# Patient Record
Sex: Female | Born: 1955 | Race: White | Hispanic: No | Smoking: Current every day smoker
Health system: Southern US, Community
[De-identification: ages and names within clinical notes are randomized; demographics above are authoritative.]

## PROBLEM LIST (undated history)

## (undated) DIAGNOSIS — R1900 Intra-abdominal and pelvic swelling, mass and lump, unspecified site: Secondary | ICD-10-CM

## (undated) DIAGNOSIS — D3910 Neoplasm of uncertain behavior of unspecified ovary: Secondary | ICD-10-CM

## (undated) DIAGNOSIS — M7989 Other specified soft tissue disorders: Secondary | ICD-10-CM

## (undated) DIAGNOSIS — R0602 Shortness of breath: Secondary | ICD-10-CM

## (undated) HISTORY — DX: Neoplasm of uncertain behavior of unspecified ovary: D39.10

## (undated) HISTORY — DX: Intra-abdominal and pelvic swelling, mass and lump, unspecified site: R19.00

## (undated) HISTORY — PX: CYSTECTOMY: SUR359

---

## 2010-10-30 DEATH — deceased

## 2014-05-03 ENCOUNTER — Encounter: Payer: Self-pay | Admitting: Gynecology

## 2014-05-03 ENCOUNTER — Ambulatory Visit: Payer: Self-pay | Attending: Gynecology | Admitting: Gynecology

## 2014-05-03 VITALS — BP 130/92 | HR 99 | Temp 97.7°F | Resp 24 | Ht 67.0 in | Wt 216.4 lb

## 2014-05-03 DIAGNOSIS — F17200 Nicotine dependence, unspecified, uncomplicated: Secondary | ICD-10-CM | POA: Insufficient documentation

## 2014-05-03 DIAGNOSIS — R971 Elevated cancer antigen 125 [CA 125]: Secondary | ICD-10-CM | POA: Insufficient documentation

## 2014-05-03 DIAGNOSIS — R1900 Intra-abdominal and pelvic swelling, mass and lump, unspecified site: Secondary | ICD-10-CM | POA: Insufficient documentation

## 2014-05-03 DIAGNOSIS — Z801 Family history of malignant neoplasm of trachea, bronchus and lung: Secondary | ICD-10-CM | POA: Insufficient documentation

## 2014-05-03 DIAGNOSIS — Z803 Family history of malignant neoplasm of breast: Secondary | ICD-10-CM | POA: Insufficient documentation

## 2014-05-03 NOTE — Progress Notes (Signed)
Consult Note: Gyn-Onc   [name] 57 y.o. female  Chief Complaint  Patient presents with  . 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 Total abdominal hysterectomy bilateral salpingo-oophorectomy. Should malignancy be identified and surgical staging will be undertaken.  Surgery scheduled for 05/17/2014. Risks of surgery reviewed and all questions are answered.    HPI: Patient is seen in consultation request of Dr. Armandina Stammer regarding management of a newly diagnosed large abdominal pelvic mass. Patient notes that she's had several months of increasing abdominal girth and weight gain. She became so uncomfortable she saw her gynecologist who obtained a CT scan showing a 32 x 20 x 29.5 cm abdominal pelvic mass which is predominantly cystic with multiple septations although there is a relatively large complex soft tissue component seen anteriorly. Patient is bilateral hydronephrosis secondary to extrinsic compression. Otherwise is no evidence of adenopathy ascites or any other suggestion of metastatic disease. The patient CA 125 is 81.9 units per mL and CEA is 4.9 ng per mL.  She has no past gynecologic history. Sexual history gravida 1. She has been menopausal for approximately 9 years.  Review of Systems:10 point review of systems is negative except as noted in interval history.   Vitals: Blood pressure 130/92, pulse 99, temperature 97.7 F (36.5 C), temperature source Oral, resp. rate 24, height 5\' 7"  (1.702 m), weight 216 lb 6.4 oz (98.158 kg).  Physical Exam: General : The patient is a healthy woman in no acute distress.  HEENT: normocephalic, extraoccular movements normal; neck is supple without thyromegally  Lynphnodes: Supraclavicular and inguinal nodes not enlarged  Abdomen: Massively distended with a mass extending  to the xiphoid. I am unable to detect any ascites. Patient has minimal tenderness to palpation. Pelvic:  EGBUS: Normal female  Vagina: Normal, no lesions  Urethra and Bladder: Normal, non-tender  Cervix: Deviated sharply anterior. Uterus: Difficult to identify secondary to the mass and the patient's habitus. Bi-manual examination: Non-tender; no adenxal masses or nodularity  Rectal: normal sphincter tone, no masses, no blood  Lower extremities: No edema or varicosities. Normal range of motion      No Known Allergies  Past Medical History  Diagnosis Date  . Pelvic mass in female     Past Surgical History  Procedure Laterality Date  . Cystectomy      "on privates" 25 years ago    Current Outpatient Prescriptions  Medication Sig Dispense Refill  . oxyCODONE-acetaminophen (PERCOCET/ROXICET) 5-325 MG per tablet Take 1-2 tablets by mouth every 4 (four) hours as needed for severe pain.       No current facility-administered medications for this visit.    History   Social History  . Marital Status: Single    Spouse Name: N/A    Number of Children: N/A  . Years of Education: N/A   Occupational History  . Not on file.   Social History Main Topics  . Smoking status: Current Every Day Smoker -- 0.25 packs/day for 41 years  . Smokeless tobacco: Not on file  . Alcohol Use: No  . Drug Use: No  . Sexual Activity: Not on file   Other Topics Concern  . Not on file   Social History Narrative  . No narrative on file    Family History  Problem Relation Age of Onset  .  Breast cancer Mother   . Lung cancer Maternal [place], MD 05/03/2014, 2:56 PM

## 2014-05-03 NOTE — Patient Instructions (Signed)
Plan for surgery on May 19 with Dr. Fermin Schwab at [facility].                  Preparing for your Surgery  Pre-operative Testing -You will receive a phone call from presurgical testing at [facility] to arrange for a pre-operative testing appointment before your surgery.  This appointment normally occurs one to two weeks before your scheduled surgery.   -Bring your insurance card, copy of an advanced directive if applicable, medication list  -At that visit, you will be asked to sign a consent for a possible blood transfusion in case a transfusion becomes necessary during surgery.  The need for a blood transfusion is rare but having consent is a necessary part of your care.     Day Before Surgery at [facility] will be asked to take in only clear liquids the day before surgery.  Examples of clear liquids include broths, jello, and clear juices.  You will need to perform a fleets enema the night before your surgery based off of your provider's recommendations.  You will be advised to have nothing to eat or drink after midnight the evening before.    Your role in recovery Your role is to become active as soon as directed by your doctor, while still giving yourself time to heal.  Rest when you feel tired. You will be asked to do the following in order to speed your recovery:  - Cough and breathe deeply. This helps toclear and expand your lungs and can prevent pneumonia. You may be given a spirometer to practice deep breathing. A staff member will show you how to use the spirometer. - Do mild physical activity. Walking or moving your legs help your circulation and body functions return to normal. A staff member will help you when you try to walk and will provide you with simple exercises. Do not try to get up or walk alone the first time. - Actively manage your pain. Managing your pain lets you move in comfort. We will ask you to rate your pain on a scale of zero to 10. It is your  responsibility to tell your doctor or nurse where and how much you hurt so your pain can be treated.  Special Considerations -If you are diabetic, you may be placed on insulin after surgery to have closer control over your blood sugars to promote healing and recovery.  This does not mean that you will be discharged on insulin.  If applicable, your oral antidiabetics will be resumed when you are tolerating a solid diet.  -Your final pathology results from surgery should be available by the Friday after surgery and the results will be relayed to you when available.

## 2014-05-04 ENCOUNTER — Telehealth: Payer: Self-pay | Admitting: *Deleted

## 2014-05-04 NOTE — Telephone Encounter (Signed)
Called pt as no insurance was on file in epic. Pt states she does not have insurance, denied for medicaid. Asked pt to find the letter from [facility] showing she was denied, we can set her up an appt for financial counselor to speak with her for possible assistance. Pt advised she would try to find letter and call us

## 2014-05-05 ENCOUNTER — Encounter (HOSPITAL_COMMUNITY): Payer: Self-pay | Admitting: Pharmacy Technician

## 2014-05-06 NOTE — Progress Notes (Signed)
Chest x ray, ekg 2/15 chart

## 2014-05-06 NOTE — Patient Instructions (Addendum)
Your procedure is scheduled on:  05/17/14  TUESDAY  Report to [facility] at   745    AM.  Call this number if you have problems the morning of surgery: [phone]      call Chayna Surratt with inhaler name phone [phone].   Do not eat food :After Midnight.SUNDAY NIGHT-- BEGIN CLEAR LIQUIDS ONLY ON Monday.   DO NOT TAKE ANYTHING BY MOUTH AFTER MIDNIGHT Monday NIGHT FLEETS ENEMA BEFORE BED MONDAY NIGHT   Take these medicines the morning of surgery with A SIP OF WATER:PERCOCET IF NEEDED, INHLER IF NEEDED, BRING INHALER AND LEAVE WITH BOYFRIEND RICHARD COBLE   .  Contacts, dentures or partial plates, or metal hairpins  can not be worn to surgery. Your family will be responsible for glasses, dentures, hearing aides while you are in surgery  Leave suitcase in the car. After surgery it may be brought to your room.  For patients admitted to the hospital, checkout time is 11:00 AM day of  discharge.                DO NOT WEAR JEWELRY, LOTIONS, POWDERS, OR PERFUMES.  WOMEN-- DO NOT SHAVE LEGS OR UNDERARMS FOR 48 HOURS BEFORE SHOWERS. MEN MAY SHAVE FACE.  Patients discharged the day of surgery will not be allowed to drive home. IF going home the day of surgery, you must have a driver and someone to stay with you for the first 24 hours                                                                                                                                [facility] - Preparing for Surgery Before surgery, you can play an important role.  Because skin is not sterile, your skin needs to be as free of germs as possible.  You can reduce the number of germs on your skin by washing with CHG (chlorahexidine gluconate) soap before surgery.  CHG is an antiseptic cleaner which kills germs and bonds with the skin to continue killing germs even after washing. Please DO NOT use if you have an allergy to CHG or antibacterial soaps.  If your skin becomes reddened/irritated stop using the CHG and  inform your nurse when you arrive at Short Stay. Do not shave (including legs and underarms) for at least 48 hours prior to the first CHG shower.  You may shave your face. Please follow these instructions carefully:  1.  Shower with CHG Soap the night before surgery and the  morning of Surgery.  2.  If you choose to wash your hair, wash your hair first as usual with your  normal  shampoo.  3.  After you shampoo, rinse your hair and body thoroughly to remove the  shampoo.  4.  Use CHG as you would any other liquid soap.  You can apply chg directly  to the skin and wash                       Gently with a scrungie or clean washcloth.  5.  Apply the CHG Soap to your body ONLY FROM THE NECK DOWN.   Do not use on open                           Wound or open sores. Avoid contact with eyes, ears mouth and genitals (private parts).                        Genitals (private parts) with your normal soap.             6.  Wash thoroughly, paying special attention to the area where your surgery  will be performed.  7.  Thoroughly rinse your body with warm water from the neck down.  8.  DO NOT shower/wash with your normal soap after using and rinsing off  the CHG Soap.                9.  Pat yourself dry with a clean towel.            10.  Wear clean pajamas.            11.  Place clean sheets on your bed the night of your first shower and do not  sleep with pets. Day of Surgery : Do not apply any lotions/deodorants the morning of surgery.  Please wear clean clothes to the hospital/surgery center.  FAILURE TO FOLLOW THESE INSTRUCTIONS MAY RESULT IN THE CANCELLATION OF YOUR SURGERY PATIENT SIGNATURE_________________________________  NURSE SIGNATURE__________________________________  ________________________________________________________________________    CLEAR LIQUID DIET   Foods Allowed                                                                     Foods  Excluded  Coffee and tea, regular and decaf                             liquids that you cannot  Plain Jell-O in any flavor                                             see through such as: Fruit ices (not with fruit pulp)                                     milk, soups, orange juice  Iced Popsicles                                    All solid food Carbonated beverages, regular and diet  Cranberry, grape and apple juices Sports drinks like Gatorade Lightly seasoned clear broth or consume(fat free) Sugar, honey syrup  Sample Menu Breakfast                                Lunch                                     Supper Cranberry juice                    Beef broth                            Chicken broth Jell-O                                     Grape juice                           Apple juice Coffee or tea                        Jell-O                                      Popsicle                                                Coffee or tea                        Coffee or tea  _____________________________________________________________________   WHAT IS A BLOOD TRANSFUSION? Blood Transfusion Information  A transfusion is the replacement of blood or some of its parts. Blood is made up of multiple cells which provide different functions.  Red blood cells carry oxygen and are used for blood loss replacement.  White blood cells fight against infection.  Platelets control bleeding.  Plasma helps clot blood.  Other blood products are available for specialized needs, such as hemophilia or other clotting disorders. BEFORE THE TRANSFUSION  Who gives blood for transfusions?   Healthy volunteers who are fully evaluated to make sure their blood is safe. This is blood bank blood. Transfusion therapy is the safest it has ever been in the practice of medicine. Before blood is taken from a donor, a complete history is taken to make sure that person has no  history of diseases nor engages in risky social behavior (examples are intravenous drug use or sexual activity with multiple partners). The donor's travel history is screened to minimize risk of transmitting infections, such as malaria. The donated blood is tested for signs of infectious diseases, such as HIV and hepatitis. The blood is then tested to be sure it is compatible with you in order to minimize the chance of a transfusion reaction. If you or a relative donates blood, this is often done in anticipation of surgery and is not appropriate for emergency situations. It takes many days to process the donated blood.  RISKS AND COMPLICATIONS Although transfusion therapy is very safe and saves many lives, the main dangers of transfusion include:   Getting an infectious disease.  Developing a transfusion reaction. This is an allergic reaction to something in the blood you were given. Every precaution is taken to prevent this. The decision to have a blood transfusion has been considered carefully by your caregiver before blood is given. Blood is not given unless the benefits outweigh the risks. AFTER THE TRANSFUSION  Right after receiving a blood transfusion, you will usually feel much better and more energetic. This is especially true if your red blood cells have gotten low (anemic). The transfusion raises the level of the red blood cells which carry oxygen, and this usually causes an energy increase.  The nurse administering the transfusion will monitor you carefully for complications. HOME CARE INSTRUCTIONS  No special instructions are needed after a transfusion. You may find your energy is better. Speak with your caregiver about any limitations on activity for underlying diseases you may have. SEEK MEDICAL CARE IF:   Your condition is not improving after your transfusion.  You develop redness or irritation at the intravenous (IV) site. SEEK IMMEDIATE MEDICAL CARE IF:  Any of the following  symptoms occur over the next 12 hours:  Shaking chills.  You have a temperature by mouth above 102 F (38.9 C), not controlled by medicine.  Chest, back, or muscle pain.  People around you feel you are not acting correctly or are confused.  Shortness of breath or difficulty breathing.  Dizziness and fainting.  You get a rash or develop hives.  You have a decrease in urine output.  Your urine turns a dark color or changes to pink, red, or brown. Any of the following symptoms occur over the next 10 days:  You have a temperature by mouth above 102 F (38.9 C), not controlled by medicine.  Shortness of breath.  Weakness after normal activity.  The white part of the eye turns yellow (jaundice).  You have a decrease in the amount of urine or are urinating less often.  Your urine turns a dark color or changes to pink, red, or brown. Document Released: 12/13/2000 Document Revised: 03/09/2012 Document Reviewed: 08/01/2008 ExitCare Patient Information 2014 [place].  _______________________________________________________________________  Incentive Spirometer  An incentive spirometer is a tool that can help keep your lungs clear and active. This tool measures how well you are filling your lungs with each breath. Taking long deep breaths may help reverse or decrease the chance of developing breathing (pulmonary) problems (especially infection) following:  A long period of time when you are unable to move or be active. BEFORE THE PROCEDURE   If the spirometer includes an indicator to show your best effort, your nurse or respiratory therapist will set it to a desired goal.  If possible, sit up straight or lean slightly forward. Try not to slouch.  Hold the incentive spirometer in an upright position. INSTRUCTIONS FOR USE  1. Sit on the edge of your bed if possible, or sit up as far as you can in bed or on a chair. 2. Hold the incentive spirometer in an upright  position. 3. Breathe out normally. 4. Place the mouthpiece in your mouth and seal your lips tightly around it. 5. Breathe in slowly and as deeply as possible, raising the piston or the ball toward the top of the column. 6. Hold your breath for 3-5 seconds or for as long as possible. Allow the piston  or ball to fall to the bottom of the column. 7. Remove the mouthpiece from your mouth and breathe out normally. 8. Rest for a few seconds and repeat Steps 1 through 7 at least 10 times every 1-2 hours when you are awake. Take your time and take a few normal breaths between deep breaths. 9. The spirometer may include an indicator to show your best effort. Use the indicator as a goal to work toward during each repetition. 10. After each set of 10 deep breaths, practice coughing to be sure your lungs are clear. If you have an incision (the cut made at the time of surgery), support your incision when coughing by placing a pillow or rolled up towels firmly against it. Once you are able to get out of bed, walk around indoors and cough well. You may stop using the incentive spirometer when instructed by your caregiver.  RISKS AND COMPLICATIONS  Take your time so you do not get dizzy or light-headed.  If you are in pain, you may need to take or ask for pain medication before doing incentive spirometry. It is harder to take a deep breath if you are having pain. AFTER USE  Rest and breathe slowly and easily.  It can be helpful to keep track of a log of your progress. Your caregiver can provide you with a simple table to help with this. If you are using the spirometer at home, follow these instructions: [place] IF:   You are having difficultly using the spirometer.  You have trouble using the spirometer as often as instructed.  Your pain medication is not giving enough relief while using the spirometer.  You develop fever of 100.5 F (38.1 C) or higher. SEEK IMMEDIATE MEDICAL CARE IF:    You cough up bloody sputum that had not been present before.  You develop fever of 102 F (38.9 C) or greater.  You develop worsening pain at or near the incision site. MAKE SURE YOU:   Understand these instructions.  Will watch your condition.  Will get help right away if you are not doing well or get worse. Document Released: 04/28/2007 Document Revised: 03/09/2012 Document Reviewed: 06/29/2007 [facility] Patient Information 2014 [place], [place].   ________________________________________________________________________

## 2014-05-09 ENCOUNTER — Other Ambulatory Visit: Payer: Self-pay | Admitting: Gynecologic Oncology

## 2014-05-09 ENCOUNTER — Encounter (HOSPITAL_COMMUNITY): Payer: Self-pay

## 2014-05-09 ENCOUNTER — Ambulatory Visit (HOSPITAL_COMMUNITY)
Admission: RE | Admit: 2014-05-09 | Discharge: 2014-05-09 | Disposition: A | Discharge status: Home / Self Care | Payer: Self-pay | Source: Ambulatory Visit | Attending: Anesthesiology | Admitting: Anesthesiology

## 2014-05-09 ENCOUNTER — Encounter (HOSPITAL_COMMUNITY)
Admission: RE | Admit: 2014-05-09 | Discharge: 2014-05-09 | Disposition: A | Discharge status: Home / Self Care | Payer: Self-pay | Source: Ambulatory Visit | Attending: Gynecology | Admitting: Gynecology

## 2014-05-09 ENCOUNTER — Telehealth: Payer: Self-pay | Admitting: *Deleted

## 2014-05-09 DIAGNOSIS — Z01818 Encounter for other preprocedural examination: Secondary | ICD-10-CM | POA: Insufficient documentation

## 2014-05-09 DIAGNOSIS — Z01812 Encounter for preprocedural laboratory examination: Secondary | ICD-10-CM | POA: Insufficient documentation

## 2014-05-09 DIAGNOSIS — A599 Trichomoniasis, unspecified: Secondary | ICD-10-CM

## 2014-05-09 DIAGNOSIS — D3910 Neoplasm of uncertain behavior of unspecified ovary: Secondary | ICD-10-CM

## 2014-05-09 DIAGNOSIS — Z01810 Encounter for preprocedural cardiovascular examination: Secondary | ICD-10-CM | POA: Insufficient documentation

## 2014-05-09 HISTORY — DX: Other specified soft tissue disorders: M79.89

## 2014-05-09 HISTORY — DX: Neoplasm of uncertain behavior of unspecified ovary: D39.10

## 2014-05-09 HISTORY — DX: Intra-abdominal and pelvic swelling, mass and lump, unspecified site: R19.00

## 2014-05-09 HISTORY — DX: Shortness of breath: R06.02

## 2014-05-09 LAB — COMPREHENSIVE METABOLIC PANEL
ALBUMIN: 3.6 g/dL (ref 3.5–5.2)
ALK PHOS: 70 U/L (ref 39–117)
ALT: 9 U/L (ref 0–35)
AST: 13 U/L (ref 0–37)
BILIRUBIN TOTAL: 0.2 mg/dL — AB (ref 0.3–1.2)
BUN: 13 mg/dL (ref 6–23)
CHLORIDE: 102 meq/L (ref 96–112)
CO2: 25 mEq/L (ref 19–32)
Calcium: 9.8 mg/dL (ref 8.4–10.5)
Creatinine, Ser: 1.32 mg/dL — ABNORMAL HIGH (ref 0.50–1.10)
GFR calc Af Amer: 51 mL/min — ABNORMAL LOW (ref >90)
GFR calc non Af Amer: 44 mL/min — ABNORMAL LOW (ref >90)
GLUCOSE: 88 mg/dL (ref 70–99)
POTASSIUM: 4.5 meq/L (ref 3.7–5.3)
SODIUM: 138 meq/L (ref 137–147)
Total Protein: 7.3 g/dL (ref 6.0–8.3)

## 2014-05-09 LAB — CBC WITH DIFFERENTIAL/PLATELET
BASOS PCT: 1 % (ref 0–1)
Basophils Absolute: 0.1 10*3/uL (ref 0.0–0.1)
EOS ABS: 0.2 10*3/uL (ref 0.0–0.7)
Eosinophils Relative: 3 % (ref 0–5)
HCT: 41.5 % (ref 36.0–46.0)
Hemoglobin: 13.7 g/dL (ref 12.0–15.0)
LYMPHS ABS: 3 10*3/uL (ref 0.7–4.0)
Lymphocytes Relative: 33 % (ref 12–46)
MCH: 30.4 pg (ref 26.0–34.0)
MCHC: 33 g/dL (ref 30.0–36.0)
MCV: 92 fL (ref 78.0–100.0)
MONOS PCT: 8 % (ref 3–12)
Monocytes Absolute: 0.7 10*3/uL (ref 0.1–1.0)
NEUTROS ABS: 5.2 10*3/uL (ref 1.7–7.7)
NEUTROS PCT: 57 % (ref 43–77)
PLATELETS: 347 10*3/uL (ref 150–400)
RBC: 4.51 MIL/uL (ref 3.87–5.11)
RDW: 13.1 % (ref 11.5–15.5)
WBC: 9.1 10*3/uL (ref 4.0–10.5)

## 2014-05-09 LAB — URINALYSIS, ROUTINE W REFLEX MICROSCOPIC
Bilirubin Urine: NEGATIVE
Glucose, UA: NEGATIVE mg/dL
Hgb urine dipstick: NEGATIVE
Ketones, ur: NEGATIVE mg/dL
NITRITE: NEGATIVE
PH: 5 (ref 5.0–8.0)
Protein, ur: NEGATIVE mg/dL
Specific Gravity, Urine: 1.015 (ref 1.005–1.030)
UROBILINOGEN UA: 0.2 mg/dL (ref 0.0–1.0)

## 2014-05-09 LAB — URINE MICROSCOPIC-ADD ON

## 2014-05-09 MED ORDER — METRONIDAZOLE 500 MG PO TABS: 2000.0000 mg | ORAL_TABLET | Freq: Once | ORAL | Status: DC

## 2014-05-09 NOTE — Telephone Encounter (Signed)
Called pt with UA results, notified pt positive for Trichomoniasis. Antibiotic called into pt's pharmacy. Notified pt, instructed pt not to have intercourse until she has been cleared after surgery, partner to go to PCP and get tested and treated. Pt verbalized understanding.

## 2014-05-09 NOTE — Progress Notes (Deleted)
lov note dr [place] neurolog[facility] 03-10-14 on chart 

## 2014-05-09 NOTE — Progress Notes (Signed)
Spoke with dr germeroth made aware of [phone] ekg results given to dr germeroth, order received from dr germeroth to repeat ekg with pre op today

## 2014-05-09 NOTE — Progress Notes (Signed)
Chest xray abnormal [facility] 02-23-14 1 view on chart, will repeat chest 2 view xray with pre op today

## 2014-05-10 NOTE — Progress Notes (Signed)
Spoke with pt by phone, pt aware urine specimen needed for morning of surgery 05-17-14

## 2014-05-10 NOTE — Progress Notes (Signed)
Lab unable to do urine culture not enough urine left notified [facility] cross Therapist, sports, Msn

## 2014-05-17 ENCOUNTER — Inpatient Hospital Stay (HOSPITAL_COMMUNITY)
Admission: RE | Admit: 2014-05-17 | Discharge: 2014-05-20 | DRG: 742 | Disposition: A | Discharge status: Home / Self Care | Payer: Self-pay | Source: Ambulatory Visit | Attending: Gynecology | Admitting: Gynecology

## 2014-05-17 ENCOUNTER — Encounter (HOSPITAL_COMMUNITY): Payer: Self-pay | Admitting: Certified Registered Nurse Anesthetist

## 2014-05-17 ENCOUNTER — Encounter (HOSPITAL_COMMUNITY): Payer: Self-pay | Admitting: *Deleted

## 2014-05-17 ENCOUNTER — Inpatient Hospital Stay (HOSPITAL_COMMUNITY): Payer: Self-pay | Admitting: Certified Registered Nurse Anesthetist

## 2014-05-17 ENCOUNTER — Encounter (HOSPITAL_COMMUNITY)
Admission: RE | Disposition: A | Discharge status: Home / Self Care | Payer: Self-pay | Source: Ambulatory Visit | Attending: Gynecology

## 2014-05-17 DIAGNOSIS — D279 Benign neoplasm of unspecified ovary: Principal | ICD-10-CM | POA: Diagnosis present

## 2014-05-17 DIAGNOSIS — D489 Neoplasm of uncertain behavior, unspecified: Secondary | ICD-10-CM

## 2014-05-17 DIAGNOSIS — N1330 Unspecified hydronephrosis: Secondary | ICD-10-CM | POA: Diagnosis present

## 2014-05-17 DIAGNOSIS — R1900 Intra-abdominal and pelvic swelling, mass and lump, unspecified site: Secondary | ICD-10-CM

## 2014-05-17 DIAGNOSIS — Z803 Family history of malignant neoplasm of breast: Secondary | ICD-10-CM

## 2014-05-17 DIAGNOSIS — Z801 Family history of malignant neoplasm of trachea, bronchus and lung: Secondary | ICD-10-CM

## 2014-05-17 DIAGNOSIS — C569 Malignant neoplasm of unspecified ovary: Secondary | ICD-10-CM | POA: Diagnosis present

## 2014-05-17 DIAGNOSIS — R971 Elevated cancer antigen 125 [CA 125]: Secondary | ICD-10-CM | POA: Diagnosis present

## 2014-05-17 DIAGNOSIS — N2889 Other specified disorders of kidney and ureter: Secondary | ICD-10-CM | POA: Diagnosis present

## 2014-05-17 HISTORY — PX: LAPAROTOMY: SHX154

## 2014-05-17 LAB — TYPE AND SCREEN
ABO/RH(D): A POS
Antibody Screen: NEGATIVE

## 2014-05-17 LAB — ABO/RH: ABO/RH(D): A POS

## 2014-05-17 SURGERY — LAPAROTOMY, EXPLORATORY
Anesthesia: General

## 2014-05-17 MED ORDER — GLYCOPYRROLATE 0.2 MG/ML IJ SOLN
INTRAMUSCULAR | Status: DC | PRN
Start: 2014-05-17 — End: 2014-05-17
  Administered 2014-05-17: 0.4 mg via INTRAVENOUS

## 2014-05-17 MED ORDER — PROMETHAZINE HCL 25 MG/ML IJ SOLN: 6.2500 mg | INTRAMUSCULAR | Status: DC | PRN

## 2014-05-17 MED ORDER — MAGNESIUM HYDROXIDE 400 MG/5ML PO SUSP
30.0000 mL | Freq: Three times a day (TID) | ORAL | Status: AC
  Filled 2014-05-17 (×2): qty 30

## 2014-05-17 MED ORDER — HYDROMORPHONE HCL PF 1 MG/ML IJ SOLN
0.5000 mg | INTRAMUSCULAR | Status: DC | PRN
  Administered 2014-05-17 – 2014-05-19 (×3): 0.5 mg via INTRAVENOUS
  Filled 2014-05-17 (×4): qty 1

## 2014-05-17 MED ORDER — OXYCODONE HCL 5 MG PO TABS
10.0000 mg | ORAL_TABLET | ORAL | Status: DC | PRN
  Administered 2014-05-18 – 2014-05-20 (×4): 10 mg via ORAL
  Filled 2014-05-17 (×4): qty 2

## 2014-05-17 MED ORDER — 0.9 % SODIUM CHLORIDE (POUR BTL) OPTIME
TOPICAL | Status: DC | PRN
  Administered 2014-05-17: 3000 mL

## 2014-05-17 MED ORDER — KETOROLAC TROMETHAMINE 30 MG/ML IJ SOLN
30.0000 mg | Freq: Four times a day (QID) | INTRAMUSCULAR | Status: AC
  Administered 2014-05-17 – 2014-05-18 (×4): 30 mg via INTRAVENOUS
  Filled 2014-05-17 (×3): qty 1

## 2014-05-17 MED ORDER — HYDROMORPHONE HCL PF 2 MG/ML IJ SOLN
INTRAMUSCULAR | Status: AC
  Filled 2014-05-17: qty 1

## 2014-05-17 MED ORDER — KCL IN DEXTROSE-NACL 20-5-0.45 MEQ/L-%-% IV SOLN
INTRAVENOUS | Status: DC
  Administered 2014-05-17 – 2014-05-18 (×2): via INTRAVENOUS
  Filled 2014-05-17 (×2): qty 1000

## 2014-05-17 MED ORDER — LACTATED RINGERS IV SOLN
INTRAVENOUS | Status: DC
  Administered 2014-05-17: 1000 mL via INTRAVENOUS

## 2014-05-17 MED ORDER — SUCCINYLCHOLINE CHLORIDE 20 MG/ML IJ SOLN
INTRAMUSCULAR | Status: DC | PRN
  Administered 2014-05-17: 100 mg via INTRAVENOUS

## 2014-05-17 MED ORDER — KETOROLAC TROMETHAMINE 30 MG/ML IJ SOLN
30.0000 mg | Freq: Four times a day (QID) | INTRAMUSCULAR | Status: AC
  Filled 2014-05-17 (×3): qty 1

## 2014-05-17 MED ORDER — HYDROMORPHONE HCL PF 1 MG/ML IJ SOLN
INTRAMUSCULAR | Status: DC | PRN
  Administered 2014-05-17: 0.5 mg via INTRAVENOUS
  Administered 2014-05-17: 1 mg via INTRAVENOUS
  Administered 2014-05-17: 0.5 mg via INTRAVENOUS

## 2014-05-17 MED ORDER — HYDROMORPHONE HCL PF 1 MG/ML IJ SOLN
INTRAMUSCULAR | Status: AC
  Filled 2014-05-17: qty 1

## 2014-05-17 MED ORDER — CISATRACURIUM BESYLATE (PF) 10 MG/5ML IV SOLN
INTRAVENOUS | Status: DC | PRN
  Administered 2014-05-17: 3 mg via INTRAVENOUS
  Administered 2014-05-17: 2 mg via INTRAVENOUS
  Administered 2014-05-17: 6 mg via INTRAVENOUS
  Administered 2014-05-17: 2 mg via INTRAVENOUS

## 2014-05-17 MED ORDER — PROPOFOL 10 MG/ML IV BOLUS
INTRAVENOUS | Status: DC | PRN
  Administered 2014-05-17: 50 mg via INTRAVENOUS
  Administered 2014-05-17: 150 mg via INTRAVENOUS

## 2014-05-17 MED ORDER — BUPIVACAINE LIPOSOME 1.3 % IJ SUSP
20.0000 mL | Freq: Once | INTRAMUSCULAR | Status: DC
  Filled 2014-05-17: qty 20

## 2014-05-17 MED ORDER — CISATRACURIUM BESYLATE 20 MG/10ML IV SOLN
INTRAVENOUS | Status: AC
  Filled 2014-05-17: qty 10

## 2014-05-17 MED ORDER — CEFAZOLIN SODIUM-DEXTROSE 2-3 GM-% IV SOLR
INTRAVENOUS | Status: AC
  Filled 2014-05-17: qty 50

## 2014-05-17 MED ORDER — MIDAZOLAM HCL 5 MG/5ML IJ SOLN
INTRAMUSCULAR | Status: DC | PRN
  Administered 2014-05-17: 2 mg via INTRAVENOUS

## 2014-05-17 MED ORDER — FENTANYL CITRATE 0.05 MG/ML IJ SOLN
INTRAMUSCULAR | Status: AC
  Filled 2014-05-17: qty 2

## 2014-05-17 MED ORDER — LACTATED RINGERS IV SOLN
INTRAVENOUS | Status: DC | PRN
  Administered 2014-05-17 (×2): via INTRAVENOUS

## 2014-05-17 MED ORDER — CEFAZOLIN SODIUM-DEXTROSE 2-3 GM-% IV SOLR
2.0000 g | INTRAVENOUS | Status: AC
  Administered 2014-05-17: 2 g via INTRAVENOUS

## 2014-05-17 MED ORDER — LIDOCAINE HCL (CARDIAC) 20 MG/ML IV SOLN
INTRAVENOUS | Status: AC
  Filled 2014-05-17: qty 5

## 2014-05-17 MED ORDER — ONDANSETRON HCL 4 MG/2ML IJ SOLN
4.0000 mg | Freq: Four times a day (QID) | INTRAMUSCULAR | Status: DC | PRN
  Administered 2014-05-17 – 2014-05-19 (×2): 4 mg via INTRAVENOUS
  Filled 2014-05-17 (×2): qty 2

## 2014-05-17 MED ORDER — SODIUM CHLORIDE 0.9 % IJ SOLN
INTRAMUSCULAR | Status: AC
  Filled 2014-05-17: qty 20

## 2014-05-17 MED ORDER — NEOSTIGMINE METHYLSULFATE 10 MG/10ML IV SOLN
INTRAVENOUS | Status: DC | PRN
  Administered 2014-05-17: 3 mg via INTRAVENOUS

## 2014-05-17 MED ORDER — FENTANYL CITRATE 0.05 MG/ML IJ SOLN
INTRAMUSCULAR | Status: AC
  Filled 2014-05-17: qty 5

## 2014-05-17 MED ORDER — GLYCOPYRROLATE 0.2 MG/ML IJ SOLN
INTRAMUSCULAR | Status: AC
  Filled 2014-05-17: qty 2

## 2014-05-17 MED ORDER — ONDANSETRON HCL 4 MG/2ML IJ SOLN
INTRAMUSCULAR | Status: DC | PRN
  Administered 2014-05-17: 4 mg via INTRAVENOUS

## 2014-05-17 MED ORDER — KCL IN DEXTROSE-NACL 20-5-0.45 MEQ/L-%-% IV SOLN
INTRAVENOUS | Status: AC
  Filled 2014-05-17: qty 1000

## 2014-05-17 MED ORDER — HEPARIN SODIUM (PORCINE) 1000 UNIT/ML IJ SOLN
INTRAMUSCULAR | Status: AC
  Filled 2014-05-17: qty 1

## 2014-05-17 MED ORDER — ALBUTEROL SULFATE (2.5 MG/3ML) 0.083% IN NEBU: 3.0000 mL | INHALATION_SOLUTION | Freq: Four times a day (QID) | RESPIRATORY_TRACT | Status: DC | PRN

## 2014-05-17 MED ORDER — NEOSTIGMINE METHYLSULFATE 10 MG/10ML IV SOLN
INTRAVENOUS | Status: AC
  Filled 2014-05-17: qty 1

## 2014-05-17 MED ORDER — ONDANSETRON HCL 4 MG/2ML IJ SOLN
INTRAMUSCULAR | Status: AC
  Filled 2014-05-17: qty 2

## 2014-05-17 MED ORDER — PROPOFOL 10 MG/ML IV BOLUS
INTRAVENOUS | Status: AC
  Filled 2014-05-17: qty 20

## 2014-05-17 MED ORDER — ENSURE COMPLETE PO LIQD
237.0000 mL | Freq: Two times a day (BID) | ORAL | Status: DC
  Administered 2014-05-18 – 2014-05-20 (×3): 237 mL via ORAL

## 2014-05-17 MED ORDER — KETOROLAC TROMETHAMINE 30 MG/ML IJ SOLN
INTRAMUSCULAR | Status: AC
  Filled 2014-05-17: qty 1

## 2014-05-17 MED ORDER — LIDOCAINE HCL (CARDIAC) 20 MG/ML IV SOLN
INTRAVENOUS | Status: DC | PRN
  Administered 2014-05-17: 80 mg via INTRAVENOUS

## 2014-05-17 MED ORDER — MIDAZOLAM HCL 2 MG/2ML IJ SOLN
INTRAMUSCULAR | Status: AC
  Filled 2014-05-17: qty 2

## 2014-05-17 MED ORDER — ACETAMINOPHEN 500 MG PO TABS
1000.0000 mg | ORAL_TABLET | Freq: Four times a day (QID) | ORAL | Status: DC
  Administered 2014-05-17 – 2014-05-20 (×9): 1000 mg via ORAL
  Filled 2014-05-17 (×14): qty 2

## 2014-05-17 MED ORDER — FENTANYL CITRATE 0.05 MG/ML IJ SOLN
INTRAMUSCULAR | Status: DC | PRN
  Administered 2014-05-17: 50 ug via INTRAVENOUS
  Administered 2014-05-17: 100 ug via INTRAVENOUS
  Administered 2014-05-17: 150 ug via INTRAVENOUS
  Administered 2014-05-17: 50 ug via INTRAVENOUS

## 2014-05-17 MED ORDER — BUPIVACAINE LIPOSOME 1.3 % IJ SUSP
INTRAMUSCULAR | Status: DC | PRN
  Administered 2014-05-17: 20 mL

## 2014-05-17 MED ORDER — LACTATED RINGERS IV SOLN: INTRAVENOUS | Status: DC

## 2014-05-17 MED ORDER — HYDROMORPHONE HCL PF 1 MG/ML IJ SOLN
0.2500 mg | INTRAMUSCULAR | Status: DC | PRN
  Administered 2014-05-17 (×2): 0.5 mg via INTRAVENOUS

## 2014-05-17 MED ORDER — ONDANSETRON HCL 4 MG PO TABS: 4.0000 mg | ORAL_TABLET | Freq: Four times a day (QID) | ORAL | Status: DC | PRN

## 2014-05-17 SURGICAL SUPPLY — 45 items
ATTRACTOMAT 16X20 MAGNETIC DRP (DRAPES) ×2 IMPLANT
BAG URINE DRAINAGE (UROLOGICAL SUPPLIES) IMPLANT
CANISTER SUCTION 2500CC (MISCELLANEOUS) IMPLANT
CHLORAPREP W/TINT 26ML (MISCELLANEOUS) ×4 IMPLANT
CLEANER TIP ELECTROSURG 2X2 (MISCELLANEOUS) ×2 IMPLANT
CLIP TI MEDIUM 6 (CLIP) ×2 IMPLANT
CLIP TI MEDIUM LARGE 6 (CLIP) ×2 IMPLANT
CONT SPEC 4OZ CLIKSEAL STRL BL (MISCELLANEOUS) ×2 IMPLANT
COVER SURGICAL LIGHT HANDLE (MISCELLANEOUS) ×2 IMPLANT
DRAPE UTILITY XL STRL (DRAPES) ×2 IMPLANT
DRAPE WARM FLUID 44X44 (DRAPE) ×2 IMPLANT
DRSG TELFA 4X14 ISLAND ADH (GAUZE/BANDAGES/DRESSINGS) ×2 IMPLANT
ELECT BLADE 6.5 EXT (BLADE) ×2 IMPLANT
ELECT REM PT RETURN 9FT ADLT (ELECTROSURGICAL) ×2
ELECTRODE REM PT RTRN 9FT ADLT (ELECTROSURGICAL) ×1 IMPLANT
GAUZE SPONGE 4X4 16PLY XRAY LF (GAUZE/BANDAGES/DRESSINGS) IMPLANT
GLOVE BIO SURGEON STRL SZ 6.5 (GLOVE) ×4 IMPLANT
GLOVE BIOGEL M STRL SZ7.5 (GLOVE) ×4 IMPLANT
GOWN STRL REUS W/TWL LRG LVL3 (GOWN DISPOSABLE) ×6 IMPLANT
GOWN STRL REUS W/TWL XL LVL3 (GOWN DISPOSABLE) IMPLANT
KIT BASIN OR (CUSTOM PROCEDURE TRAY) ×2 IMPLANT
LIGASURE IMPACT 36 18CM CVD LR (INSTRUMENTS) IMPLANT
NS IRRIG 1000ML POUR BTL (IV SOLUTION) IMPLANT
PACK GENERAL/GYN (CUSTOM PROCEDURE TRAY) ×2 IMPLANT
SHEET LAVH (DRAPES) ×2 IMPLANT
SPONGE GAUZE 4X4 12PLY (GAUZE/BANDAGES/DRESSINGS) ×2 IMPLANT
SPONGE LAP 18X18 X RAY DECT (DISPOSABLE) ×10 IMPLANT
STAPLER VISISTAT 35W (STAPLE) ×2 IMPLANT
SUT ETHILON 1 LR 30 (SUTURE) IMPLANT
SUT PDS AB 1 CTXB1 36 (SUTURE) ×4 IMPLANT
SUT SILK 2 0 (SUTURE)
SUT SILK 2 0 30  PSL (SUTURE)
SUT SILK 2 0 30 PSL (SUTURE) IMPLANT
SUT SILK 2-0 18XBRD TIE 12 (SUTURE) IMPLANT
SUT VIC AB 0 CT1 36 (SUTURE) ×6 IMPLANT
SUT VIC AB 2-0 CT2 27 (SUTURE) ×16 IMPLANT
SUT VIC AB 2-0 SH 27 (SUTURE) ×4
SUT VIC AB 2-0 SH 27X BRD (SUTURE) ×4 IMPLANT
SUT VIC AB 3-0 CTX 36 (SUTURE) ×2 IMPLANT
SUT VICRYL 2 0 18  UND BR (SUTURE) ×1
SUT VICRYL 2 0 18 UND BR (SUTURE) ×1 IMPLANT
TOWEL OR 17X26 10 PK STRL BLUE (TOWEL DISPOSABLE) ×2 IMPLANT
TOWEL OR NON WOVEN STRL DISP B (DISPOSABLE) ×2 IMPLANT
TRAY FOLEY CATH 14FRSI W/METER (CATHETERS) ×2 IMPLANT
WATER STERILE IRR 1500ML POUR (IV SOLUTION) IMPLANT

## 2014-05-17 NOTE — Anesthesia Procedure Notes (Addendum)
Performed by: Frances Furbish   Procedure Name: Intubation Date/Time: 05/17/2014 11:08 AM Performed by: Frances Furbish Pre-anesthesia Checklist: Patient identified, Emergency Drugs available, Suction available, Patient being monitored and Timeout performed Patient Re-evaluated:Patient Re-evaluated prior to inductionOxygen Delivery Method: Circle system utilized Preoxygenation: Pre-oxygenation with 100% oxygen Intubation Type: IV induction Ventilation: Mask ventilation without difficulty Laryngoscope Size: Mac and 3 Grade View: Grade I Tube type: Oral Tube size: 7.5 mm Number of attempts: 2 Placement Confirmation: ETT inserted through vocal cords under direct vision,  positive ETCO2 and breath sounds checked- equal and bilateral Secured at: 21.5 cm Dental Injury: Teeth and Oropharynx as per pre-operative assessment

## 2014-05-17 NOTE — Transfer of Care (Signed)
Immediate Anesthesia Transfer of Care Note  Patient: [name]  Procedure(s) Performed: Procedure(s): EXPLORATORY LAPAROTOMY TOTAL ABDOMINAL HYSTERECTOMY BILATERAL SALPINGO OOPHORECTOMY, URETERLYSIS, OMENTAL BIOPSY (N/A)  Patient Location: PACU  Anesthesia Type:General  Level of Consciousness: awake, alert  and oriented  Airway & Oxygen Therapy: Patient connected to face mask oxygen  Post-op Assessment: Report given to PACU RN  Post vital signs: Reviewed  Complications: No apparent anesthesia complications

## 2014-05-17 NOTE — Anesthesia Postprocedure Evaluation (Signed)
Anesthesia Post Note  Patient: [name]  Procedure(s) Performed: Procedure(s) (LRB): EXPLORATORY LAPAROTOMY TOTAL ABDOMINAL HYSTERECTOMY BILATERAL SALPINGO OOPHORECTOMY, URETERLYSIS, OMENTAL BIOPSY (N/A)  Anesthesia type: General  Patient location: PACU  Post pain: Pain level controlled  Post assessment: Post-op Vital signs reviewed  Last Vitals:  Filed Vitals:   05/17/14 1413  BP: 147/93  Pulse: 76  Temp: 36.4 C  Resp: 12    Post vital signs: Reviewed  Level of consciousness: sedated  Complications: No apparent anesthesia complications

## 2014-05-17 NOTE — Op Note (Signed)
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  SURGEON: Marti Sleigh, M.D ASSISTANT: Lahoma Crocker, MD ANESTHESIA: Gen. oral tracheal tube ESTIMATED BLOOD LOSS: 300 mL  SURGICAL FINDINGS: At the time of exploratory laparotomy there was a 3 cm solid and cystic mass arising from the left ovary. The mass and dissected into the left broad ligament and was densely adherent to the left ureter. The right tube and ovary appeared normal. Uterus was normal but deviated to the right. Exploration the upper abdomen revealed no abnormalities the diaphragm liver spleen or omentum. The appendix appeared normal.  PROCEDURE:  The patient is brought to the operating room and after satisfactory attainment of general anesthesia was placed in a modified lithotomy position in [place]. The anterior abdominal wall, perineum, and vagina were prepped, a Foley catheter was inserted, and the patient was draped. A timeout was taken and antibiotics were administered.    The abdomen was entered through midline incision. There was a small amount of ascites which was aspirated and sent to cytopathology. The upper abdomen and pelvis were explored with the above-noted findings. Using a gallbladder trocar, the cyst was drained of several loculated areas. In total there was 8500 mL aspirated from the cystic mass. A Bookwalter retractor was positioned and the left side the pelvis exposed. The left round ligament was divided and the retroperitoneal space opened. It is noted that the left ureter was significantly dilated. The ovarian vessels were isolated  clamped cut free tied and suture-ligated. IT was noted that the cystic mass had dissected into the broad ligament down to the level of the posterior cul-de-sac. Ureteral lysis was performed in order mobilizing ureter laterally away from the mass. We then incised the peritoneum of the pelvic sidewall just lateral to the mass and using sharp and blunt dissection and cautery for hemostasis further mobilized the mass. The bladder flap was advanced with sharp and blunt dissection. The right retroperitoneal space was opened identifying the ureter and vessels. The ovarian vessels were skeletonized clamped cut free tied and suture-ligated. Uterine vessels were identified on either side of the cervix. These were clamped cut and suture ligated. In order to get better exposure we transected the upper cervix above the uterine vessels and submitted the uterus tubes and ovaries for frozen section. Frozen section returned as a mucinous cystadenoma pending further evaluation given the tumor's extensive size. The remainder the cervix was removed by clamping the cardinal and paracervical tissues cutting and ligating them. The vaginal angles were then crossclamped and the vagina transected from its connection to the cervix. The vagina was closed with interrupted figure-of-eight sutures of 0 Vicryl. Hemostasis the pelvis achieved with cautery.  Given the uncertainty of the final pathology, we obtained an omental biopsy for staging purposes. This was achieved by clamping the omental pedicles with Kelly clamps and free tying those pedicles with 2-0 Vicryl suture. The pelvis was reexplored and irrigated and found to be hemostatic.  The retractors and packs were removed and the anterior abdominal wall closed in layers. The first layer being a running mass closure using #1 PDS. Subcutaneous tissue was irrigated and experil was injected into the subcutaneous layer. Skin was closed skin staples. A dressing was applied. Patient was  awakened from anesthesia and taken to the recovery room in satisfactory condition. Sponge needle and isthmic counts correct x2.Marti Sleigh, M.D

## 2014-05-17 NOTE — Interval H&P Note (Signed)
History and Physical Interval Note:  05/17/2014 10:39 AM  [name]  has presented today for surgery, with the diagnosis of PELVIC MASS  The various methods of treatment have been discussed with the patient and family. After consideration of risks, benefits and other options for treatment, the patient has consented to  Procedure(s): EXPLORATORY LAPAROTOMY TOTAL ABDOMINAL HYSTERECTOMY BILATERAL SALPINGO OOPHORECTOMY RESECTION OF PELVIC MASS AND POSSIBLE STAGING (N/A) as a surgical intervention .  The patient's history has been reviewed, patient examined, no change in status, stable for surgery.  I have reviewed the patient's chart and labs.  Questions were answered to the patient's satisfaction.     [place]

## 2014-05-17 NOTE — H&P (View-Only) (Signed)
Consult Note: Gyn-Onc   [name] 57 y.o. female  Chief Complaint  Patient presents with  . 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 Total abdominal hysterectomy bilateral salpingo-oophorectomy. Should malignancy be identified and surgical staging will be undertaken.  Surgery scheduled for 05/17/2014. Risks of surgery reviewed and all questions are answered.    HPI: Patient is seen in consultation request of Dr. Armandina Stammer regarding management of a newly diagnosed large abdominal pelvic mass. Patient notes that she's had several months of increasing abdominal girth and weight gain. She became so uncomfortable she saw her gynecologist who obtained a CT scan showing a 32 x 20 x 29.5 cm abdominal pelvic mass which is predominantly cystic with multiple septations although there is a relatively large complex soft tissue component seen anteriorly. Patient is bilateral hydronephrosis secondary to extrinsic compression. Otherwise is no evidence of adenopathy ascites or any other suggestion of metastatic disease. The patient CA 125 is 81.9 units per mL and CEA is 4.9 ng per mL.  She has no past gynecologic history. Sexual history gravida 1. She has been menopausal for approximately 9 years.  Review of Systems:10 point review of systems is negative except as noted in interval history.   Vitals: Blood pressure 130/92, pulse 99, temperature 97.7 F (36.5 C), temperature source Oral, resp. rate 24, height 5\' 7"  (1.702 m), weight 216 lb 6.4 oz (98.158 kg).  Physical Exam: General : The patient is a healthy woman in no acute distress.  HEENT: normocephalic, extraoccular movements normal; neck is supple without thyromegally  Lynphnodes: Supraclavicular and inguinal nodes not enlarged  Abdomen: Massively distended with a mass extending  to the xiphoid. I am unable to detect any ascites. Patient has minimal tenderness to palpation. Pelvic:  EGBUS: Normal female  Vagina: Normal, no lesions  Urethra and Bladder: Normal, non-tender  Cervix: Deviated sharply anterior. Uterus: Difficult to identify secondary to the mass and the patient's habitus. Bi-manual examination: Non-tender; no adenxal masses or nodularity  Rectal: normal sphincter tone, no masses, no blood  Lower extremities: No edema or varicosities. Normal range of motion      No Known Allergies  Past Medical History  Diagnosis Date  . Pelvic mass in female     Past Surgical History  Procedure Laterality Date  . Cystectomy      "on privates" 25 years ago    Current Outpatient Prescriptions  Medication Sig Dispense Refill  . oxyCODONE-acetaminophen (PERCOCET/ROXICET) 5-325 MG per tablet Take 1-2 tablets by mouth every 4 (four) hours as needed for severe pain.       No current facility-administered medications for this visit.    History   Social History  . Marital Status: Single    Spouse Name: N/A    Number of Children: N/A  . Years of Education: N/A   Occupational History  . Not on file.   Social History Main Topics  . Smoking status: Current Every Day Smoker -- 0.25 packs/day for 41 years  . Smokeless tobacco: Not on file  . Alcohol Use: No  . Drug Use: No  . Sexual Activity: Not on file   Other Topics Concern  . Not on file   Social History Narrative  . No narrative on file    Family History  Problem Relation Age of Onset  .  Breast cancer Mother   . Lung cancer Maternal [place], MD 05/03/2014, 2:56 PM

## 2014-05-17 NOTE — Anesthesia Preprocedure Evaluation (Signed)
Anesthesia Evaluation  Patient identified by MRN, date of birth, ID band Patient awake    Reviewed: Allergy & Precautions, H&P , NPO status , Patient's Chart, lab work & pertinent test results  Airway Mallampati: II TM Distance: >3 FB Neck ROM: Full    Dental  (+) Poor Dentition, Chipped, Dental Advisory Given,    Pulmonary neg pulmonary ROS, shortness of breath and with exertion, Current Smoker,  breath sounds clear to auscultation        Cardiovascular negative cardio ROS  Rhythm:Regular Rate:Normal     Neuro/Psych negative neurological ROS  negative psych ROS   GI/Hepatic negative GI ROS, Neg liver ROS,   Endo/Other  negative endocrine ROS  Renal/GU negative Renal ROS   Large pelvic mass. negative genitourinary   Musculoskeletal negative musculoskeletal ROS (+)   Abdominal   Peds  Hematology negative hematology ROS (+)   Anesthesia Other Findings   Reproductive/Obstetrics                           Anesthesia Physical Anesthesia Plan  ASA: III  Anesthesia Plan: General   Post-op Pain Management:    Induction: Intravenous  Airway Management Planned: Oral ETT  Additional Equipment:   Intra-op Plan:   Post-operative Plan: Extubation in OR  Informed Consent: I have reviewed the patients History and Physical, chart, labs and discussed the procedure including the risks, benefits and alternatives for the proposed anesthesia with the patient or authorized representative who has indicated his/her understanding and acceptance.   Dental advisory given  Plan Discussed with: CRNA  Anesthesia Plan Comments:         Anesthesia Quick Evaluation

## 2014-05-18 LAB — BASIC METABOLIC PANEL
BUN: 8 mg/dL (ref 6–23)
CALCIUM: 8.1 mg/dL — AB (ref 8.4–10.5)
CO2: 25 meq/L (ref 19–32)
CREATININE: 1.09 mg/dL (ref 0.50–1.10)
Chloride: 106 mEq/L (ref 96–112)
GFR calc Af Amer: 64 mL/min — ABNORMAL LOW (ref >90)
GFR calc non Af Amer: 55 mL/min — ABNORMAL LOW (ref >90)
GLUCOSE: 123 mg/dL — AB (ref 70–99)
Potassium: 4.3 mEq/L (ref 3.7–5.3)
Sodium: 139 mEq/L (ref 137–147)

## 2014-05-18 LAB — URINE CULTURE
Colony Count: 35000
Special Requests: NORMAL

## 2014-05-18 LAB — CBC
HCT: 34.8 % — ABNORMAL LOW (ref 36.0–46.0)
Hemoglobin: 11.1 g/dL — ABNORMAL LOW (ref 12.0–15.0)
MCH: 29.6 pg (ref 26.0–34.0)
MCHC: 31.9 g/dL (ref 30.0–36.0)
MCV: 92.8 fL (ref 78.0–100.0)
PLATELETS: 274 10*3/uL (ref 150–400)
RBC: 3.75 MIL/uL — ABNORMAL LOW (ref 3.87–5.11)
RDW: 12.9 % (ref 11.5–15.5)
WBC: 9 10*3/uL (ref 4.0–10.5)

## 2014-05-18 MED ORDER — IBUPROFEN 600 MG PO TABS
600.0000 mg | ORAL_TABLET | Freq: Four times a day (QID) | ORAL | Status: DC
  Administered 2014-05-18 – 2014-05-20 (×9): 600 mg via ORAL
  Filled 2014-05-18 (×12): qty 1

## 2014-05-18 NOTE — Progress Notes (Signed)
1 Day Post-Op Procedure(s) (LRB): EXPLORATORY LAPAROTOMY TOTAL ABDOMINAL HYSTERECTOMY BILATERAL SALPINGO OOPHORECTOMY, URETERLYSIS, OMENTAL BIOPSY (N/A)  Subjective: Patient reports no complaings    Objective: Vital signs in last 24 hours: Temp:  [97.3 F (36.3 C)-98.3 F (36.8 C)] 98.3 F (36.8 C) (05/20 0957) Pulse Rate:  [59-82] 73 (05/20 0957) Resp:  [12-18] 16 (05/20 0957) BP: (96-147)/(59-93) 107/67 mmHg (05/20 0957) SpO2:  [93 %-97 %] 97 % (05/20 0957) Weight:  [97.16 kg (214 lb 3.2 oz)] 97.16 kg (214 lb 3.2 oz) (05/19 1425) Last BM Date: 05/16/14  Intake/Output from previous day: 05/19 0701 - 05/20 0700 In: 2918.8 [I.V.:2918.8] Out: 9600 [Urine:1000; Emesis/NG output:100]  Physical Examination: General: alert Resp: clear to auscultation bilaterally Cardio: regular rate and rhythm, S1, S2 normal, no murmur, click, rub or gallop GI: soft, non-tender; bowel sounds normal; no masses,  no organomegaly and incision: clean, dry and intact Extremities: extremities normal, atraumatic, no cyanosis or edema Vaginal Bleeding: none  Labs: WBC/Hgb/Hct/Plts:  9.0/11.1/34.8/274 (05/20 0526) BUN/Cr/glu/ALT/AST/amyl/lip:  8/1.09/--/--/--/--/-- (05/20 0526)   Assessment:  58 y.o. s/p Procedure(s): EXPLORATORY LAPAROTOMY TOTAL ABDOMINAL HYSTERECTOMY BILATERAL SALPINGO OOPHORECTOMY, URETERLYSIS, OMENTAL BIOPSY: stable Pain:  Pain is well-controlled on oral medications.  GI:  Tolerating po: Yes     Prophylaxis: intermittent pneumatic compression boots.  Plan: Advance diet Encourage ambulation Discontinue IV fluids    LOS: 1 day    Taffie Eckmann 05/18/2014, 11:17 AM

## 2014-05-18 NOTE — Care Management Note (Signed)
    Page 1 of 1   05/18/2014     1:18:47 PM CARE MANAGEMENT NOTE 05/18/2014  Patient:  [name]   Account Number:  [id number]  Date Initiated:  05/18/2014  Documentation initiated by:  Dessa Phi  Subjective/Objective Assessment:   58 Y/O F ADMITTED W/PELVIC MASS.     Action/Plan:   FROM HOME.NO PCP,HAS PHARMACY.   Anticipated DC Date:  05/19/2014   Anticipated DC Plan:  HOME/SELF CARE  In-house referral  Financial Counselor      DC Planning Services  CM consult  Medication Assistance      Choice offered to / List presented to:             Status of service:  In process, will continue to follow Medicare Important Message given?   (If response is "NO", the following Medicare IM given date fields will be blank) Date Medicare IM given:   Date Additional Medicare IM given:    Discharge Disposition:    Per UR Regulation:  Reviewed for med. necessity/level of care/duration of stay  If discussed at [place] of Stay Meetings, dates discussed:    Comments:  05/18/14 [name],BSN Macias [phone] POD#1 TAH/BSO.PROVIDED W/INSURANCE WEBSITE/COMMUNITY RESOURCES/$[facility] MED LIST.PATIENT WILL FIND PCP ON OWN.

## 2014-05-19 ENCOUNTER — Encounter (HOSPITAL_COMMUNITY): Payer: Self-pay | Admitting: Gynecology

## 2014-05-19 LAB — CBC
HEMATOCRIT: 34.6 % — AB (ref 36.0–46.0)
Hemoglobin: 11.3 g/dL — ABNORMAL LOW (ref 12.0–15.0)
MCH: 30.1 pg (ref 26.0–34.0)
MCHC: 32.7 g/dL (ref 30.0–36.0)
MCV: 92.3 fL (ref 78.0–100.0)
Platelets: 305 10*3/uL (ref 150–400)
RBC: 3.75 MIL/uL — AB (ref 3.87–5.11)
RDW: 13.1 % (ref 11.5–15.5)
WBC: 9.8 10*3/uL (ref 4.0–10.5)

## 2014-05-19 LAB — BASIC METABOLIC PANEL
BUN: 8 mg/dL (ref 6–23)
CHLORIDE: 105 meq/L (ref 96–112)
CO2: 26 mEq/L (ref 19–32)
Calcium: 8.4 mg/dL (ref 8.4–10.5)
Creatinine, Ser: 0.99 mg/dL (ref 0.50–1.10)
GFR calc non Af Amer: 62 mL/min — ABNORMAL LOW (ref >90)
GFR, EST AFRICAN AMERICAN: 72 mL/min — AB (ref >90)
GLUCOSE: 105 mg/dL — AB (ref 70–99)
Potassium: 4.5 mEq/L (ref 3.7–5.3)
SODIUM: 140 meq/L (ref 137–147)

## 2014-05-19 MED ORDER — BISACODYL 10 MG RE SUPP
10.0000 mg | Freq: Every day | RECTAL | Status: DC | PRN
  Administered 2014-05-20: 10 mg via RECTAL
  Filled 2014-05-19: qty 1

## 2014-05-19 MED ORDER — KCL IN DEXTROSE-NACL 20-5-0.45 MEQ/L-%-% IV SOLN
INTRAVENOUS | Status: DC
  Administered 2014-05-19 – 2014-05-20 (×3): via INTRAVENOUS
  Filled 2014-05-19 (×7): qty 1000

## 2014-05-19 NOTE — Progress Notes (Signed)
Patient ID: [name], female   DOB: [birth year], 58 y.o.   MRN: [id number] 2 Days Post-Op Procedure(s) (LRB): EXPLORATORY LAPAROTOMY TOTAL ABDOMINAL HYSTERECTOMY BILATERAL SALPINGO OOPHORECTOMY, URETERLYSIS, OMENTAL BIOPSY (N/A)  Subjective: Patient reports N/V  Objective: Vital signs in last 24 hours: Temp:  [97.8 F (36.6 C)-98 F (36.7 C)] 97.8 F (36.6 C) (05/21 0525) Pulse Rate:  [65-77] 65 (05/21 0525) Resp:  [16] 16 (05/21 0525) BP: (96-101)/(61-65) 98/63 mmHg (05/21 0525) SpO2:  [92 %-95 %] 92 % (05/21 0525) Last BM Date: 05/16/14  Intake/Output from previous day: 05/20 0701 - 05/21 0700 In: 360 [P.O.:360] Out: 1600 [Urine:1450; Emesis/NG output:150]  Physical Examination: General: alert Resp: clear to auscultation bilaterally Cardio: regular rate and rhythm, S1, S2 normal, no murmur, click, rub or gallop GI: soft, non-tender; bowel sounds normal; no masses,  no organomegaly and incision: clean, dry and intact Extremities: extremities normal, atraumatic, no cyanosis or edema Vaginal Bleeding: small heme  Labs: WBC/Hgb/Hct/Plts:  9.8/11.3/34.6/305 (05/21 1014)     Assessment:  58 y.o. s/p Procedure(s): EXPLORATORY LAPAROTOMY TOTAL ABDOMINAL HYSTERECTOMY BILATERAL SALPINGO OOPHORECTOMY, URETERLYSIS, OMENTAL BIOPSY: stable Pain:  Pain is well-controlled on oral medications.  GI:  Tolerating po: No; mild ileus s/p resection of a large intraabdominal mass     Prophylaxis: intermittent pneumatic compression boots.  Plan: Bowel rest--sips of clears Check electrolytes Encourage ambulation Continue IV fluids    LOS: 2 days    [name] 05/19/2014, 10:58 AM

## 2014-05-20 LAB — BASIC METABOLIC PANEL
BUN: 5 mg/dL — AB (ref 6–23)
CO2: 26 mEq/L (ref 19–32)
Calcium: 8.3 mg/dL — ABNORMAL LOW (ref 8.4–10.5)
Chloride: 107 mEq/L (ref 96–112)
Creatinine, Ser: 1.03 mg/dL (ref 0.50–1.10)
GFR calc Af Amer: 69 mL/min — ABNORMAL LOW (ref >90)
GFR, EST NON AFRICAN AMERICAN: 59 mL/min — AB (ref >90)
GLUCOSE: 110 mg/dL — AB (ref 70–99)
POTASSIUM: 4.2 meq/L (ref 3.7–5.3)
Sodium: 139 mEq/L (ref 137–147)

## 2014-05-20 LAB — CBC
HEMATOCRIT: 32.1 % — AB (ref 36.0–46.0)
Hemoglobin: 10.7 g/dL — ABNORMAL LOW (ref 12.0–15.0)
MCH: 30.7 pg (ref 26.0–34.0)
MCHC: 33.3 g/dL (ref 30.0–36.0)
MCV: 92 fL (ref 78.0–100.0)
Platelets: 262 10*3/uL (ref 150–400)
RBC: 3.49 MIL/uL — ABNORMAL LOW (ref 3.87–5.11)
RDW: 13.1 % (ref 11.5–15.5)
WBC: 7 10*3/uL (ref 4.0–10.5)

## 2014-05-20 NOTE — Discharge Instructions (Signed)
05/20/2014  Return to work: 6 weeks  Activity: 1. Be up and out of the bed during the day.  Take a nap if needed.  You may walk up steps but be careful and use the hand rail.  Stair climbing will tire you more than you think, you may need to stop part way and rest.   2. No lifting or straining for 6 weeks.  3. No driving for 1 week.  Do Not drive if you are taking narcotic pain medicine.  4. Shower daily.  Use soap and water on your incision and pat dry; don't rub.   5. No sexual activity and nothing in the vagina for 6 weeks.  Diet: 1. Low sodium Heart Healthy Diet is recommended.  2. It is safe to use a laxative if you have difficulty moving your bowels.   Wound Care: 1. Keep clean and dry.  Shower daily.  Reasons to call the Doctor:   Fever - Oral temperature greater than 100.4 degrees Fahrenheit  Foul-smelling vaginal discharge  Difficulty urinating  Nausea and vomiting  Increased pain at the site of the incision that is unrelieved with pain medicine.  Difficulty breathing with or without chest pain  New calf pain especially if only on one side  Sudden, continuing increased vaginal bleeding with or without clots.     Contacts: For questions or concerns you should contact:  Dr. Lahoma Crocker at [phone]  Dr. Marti Sleigh at [facility] [phone]  Hysterectomy Care After Refer to this sheet in the next few weeks. These instructions provide you with information on caring for yourself after your procedure. Your caregiver may also give you more specific instructions. Your treatment has been planned according to current medical practices, but problems sometimes occur. Call your caregiver if you have any problems or questions after your procedure. HOME CARE INSTRUCTIONS  Healing will take time. You may have discomfort, tenderness, swelling, and bruising at the surgical site for about 2 weeks. This is normal and will get better as time goes  on.  Only take over-the-counter or prescription medicines for pain, discomfort, or fever as directed by your caregiver.  Do not take aspirin. It can cause bleeding.  Do not drive when taking pain medicine.  Follow your caregiver's advice regarding exercise, lifting, driving, and general activities.  Resume your usual diet as directed and allowed.  Get plenty of rest and sleep.  Do not douche, use tampons, or have sexual intercourse for at least 6 weeks or until your caregiver gives you permission.  Change your bandages (dressings) as directed by your caregiver.  Monitor your temperature.  Take showers instead of baths for 2 to 3 weeks.  Do not drink alcohol until your caregiver gives you permission.  If you are constipated, you may take a mild laxative with your caregiver's permission. Bran foods may help with constipation problems. Drinking enough fluids to keep your urine clear or pale yellow may help as well.  Try to have someone home with you for 1 or 2 weeks to help around the house.  Keep all of your follow-up appointments as directed by your caregiver. SEEK MEDICAL CARE IF:   You have swelling, redness, or increasing pain in the surgical cut (incision) area.  You have pus coming from the incision.  You notice a bad smell coming from the incision or dressing.  You have swelling, redness, or pain around the intravenous (IV) site.  Your incision breaks open.  You feel dizzy or lightheaded.  You have pain or bleeding when you urinate.  You have persistent diarrhea.  You have persistent nausea and vomiting.  You have abnormal vaginal discharge.  You have a rash.  You have any type of abnormal reaction or develop an allergy to your medicine.  Your pain is not controlled with your prescribed medicine. SEEK IMMEDIATE MEDICAL CARE IF:   You have a fever.  You have severe abdominal pain.  You have chest pain.  You have shortness of breath.  You  faint.  You have pain, swelling, or redness of your leg.  You have heavy vaginal bleeding with blood clots. MAKE SURE YOU:  Understand these instructions.  Will watch your condition.  Will get help right away if you are not doing well or get worse. Document Released: 07/05/2005 Document Revised: 03/09/2012 Document Reviewed: 08/02/2011 [facility] Patient Information 2014 [place].

## 2014-05-20 NOTE — Progress Notes (Signed)
Patient ID: [name], female   DOB: 07-21-1956, 58 y.o.   MRN: [id number] 3 Days Post-Op Procedure(s) (LRB): EXPLORATORY LAPAROTOMY TOTAL ABDOMINAL HYSTERECTOMY BILATERAL SALPINGO OOPHORECTOMY, URETERLYSIS, OMENTAL BIOPSY (N/A)  Subjective: Patient reports flatus/no BM  Objective: Vital signs in last 24 hours: Temp:  [97.7 F (36.5 C)-98.3 F (36.8 C)] 98.3 F (36.8 C) (05/22 0509) Pulse Rate:  [64-77] 64 (05/22 0509) Resp:  [16] 16 (05/22 0509) BP: (96-133)/(62-84) 96/62 mmHg (05/22 0509) SpO2:  [95 %-96 %] 95 % (05/22 0509) Last BM Date: 05/16/14  Intake/Output from previous day: 05/21 0701 - 05/22 0700 In: 3140.4 [P.O.:780; I.V.:2360.4] Out: 1500 [Urine:1500]  Physical Examination: General: alert Resp: clear to auscultation bilaterally Cardio: regular rate and rhythm, S1, S2 normal, no murmur, click, rub or gallop GI: soft, non-tender; bowel sounds normal; no masses,  no organomegaly and incision: clean, dry and intact Extremities: extremities normal, atraumatic, no cyanosis or edema Vaginal Bleeding: none  Labs: WBC/Hgb/Hct/Plts:  7.0/10.7/32.1/262 (05/22 0446) BUN/Cr/glu/ALT/AST/amyl/lip:  5/1.03/--/--/--/--/-- (05/22 0446)   Assessment:  58 y.o. s/p Procedure(s): EXPLORATORY LAPAROTOMY TOTAL ABDOMINAL HYSTERECTOMY BILATERAL SALPINGO OOPHORECTOMY, URETERLYSIS, OMENTAL BIOPSY: stable Pain:  Pain is well-controlled on oral medications.  GI:  N/V resolved     Prophylaxis: intermittent pneumatic compression boots.  Plan: Advance diet Possible discharge home later today Encourage ambulation     LOS: 3 days    Jamee Keach 05/20/2014, 8:52 AM

## 2014-05-20 NOTE — Discharge Summary (Signed)
Physician Discharge Summary  Patient ID: [name] MRN: [id number] DOB/AGE: Oct 28, 1956 58 y.o.  Admit date: 05/17/2014 Discharge date: 05/20/2014  Admission Diagnoses: Active Problems:   Pelvic mass in female   Discharge Diagnoses:  Active Problems:   Pelvic mass in female   Discharged Condition: good  Hospital Course: On 05/17/2014, the patient underwent the following: Procedure(s): EXPLORATORY LAPAROTOMY TOTAL ABDOMINAL HYSTERECTOMY BILATERAL SALPINGO OOPHORECTOMY, URETERLYSIS, OMENTAL BIOPSY.   The postoperative course was uneventful.  She was discharged to home on postoperative day 3 tolerating a regular diet.  Consults: None  Significant Diagnostic Studies: None  Treatments: surgery: see above  Discharge Exam: Blood pressure 98/67, pulse 85, temperature 98 F (36.7 C), temperature source Oral, resp. rate 17, height 5\' 7"  (1.702 m), weight 97.16 kg (214 lb 3.2 oz), SpO2 97.00%. General appearance: alert Resp: clear to auscultation bilaterally Cardio: regular rate and rhythm, S1, S2 normal, no murmur, click, rub or gallop GI: soft, non-tender; bowel sounds normal; no masses,  no organomegaly Incision/Wound: C/D/I  Disposition: Final discharge disposition not confirmed  Discharge Instructions   Call MD for:  extreme fatigue    Complete by:  As directed      Call MD for:  persistant dizziness or light-headedness    Complete by:  As directed      Call MD for:  persistant nausea and vomiting    Complete by:  As directed      Call MD for:  redness, tenderness, or signs of infection (pain, swelling, redness, odor or green/yellow discharge around incision site)    Complete by:  As directed      Call MD for:  severe uncontrolled pain    Complete by:  As directed      Call MD for:  temperature >100.4    Complete by:  As directed      Diet - low sodium heart healthy    Complete by:  As directed      Discharge wound care:    Complete by:  As directed   Keep clean  and dry     Driving Restrictions    Complete by:  As directed   No driving for 1- 2 weeks     Increase activity slowly    Complete by:  As directed      Lifting restrictions    Complete by:  As directed   No lifting > 5 lbs for 6 weeks     May shower / Bathe    Complete by:  As directed   No tub baths for 6 weeks     May walk up steps    Complete by:  As directed      Sexual Activity Restrictions    Complete by:  As directed   No intercourse for 6  weeks            Medication List         ibuprofen 200 MG tablet  Commonly known as:  ADVIL,MOTRIN  Take 200 mg by mouth every 6 (six) hours as needed for mild pain.     metroNIDAZOLE 500 MG tablet  Commonly known as:  FLAGYL  Take 4 tablets (2,000 mg total) by mouth once.     oxyCODONE-acetaminophen 5-325 MG per tablet  Commonly known as:  PERCOCET/ROXICET  Take 1-2 tablets by mouth every 4 (four) hours as needed for severe pain.     PROVENTIL HFA 108 (90 BASE) MCG/ACT inhaler  Generic drug:  albuterol  Inhale  2 puffs into the lungs every 6 (six) hours as needed for wheezing or shortness of breath.           Follow-up Information   Schedule an appointment as soon as possible for a visit with Alvino Chapel, MD.   Specialty:  Gynecology   Contact information:   [address] [place] [address] [phone]       Signed: Justus Duerr 05/20/2014, 5:25 PM

## 2014-06-27 ENCOUNTER — Ambulatory Visit: Payer: Self-pay | Attending: Gynecology | Admitting: Gynecology

## 2014-06-27 ENCOUNTER — Encounter: Payer: Self-pay | Admitting: Gynecology

## 2014-06-27 VITALS — BP 117/68 | HR 69 | Temp 98.4°F | Resp 16 | Ht 67.0 in | Wt 181.4 lb

## 2014-06-27 DIAGNOSIS — Z79899 Other long term (current) drug therapy: Secondary | ICD-10-CM | POA: Insufficient documentation

## 2014-06-27 DIAGNOSIS — D3912 Neoplasm of uncertain behavior of left ovary: Secondary | ICD-10-CM

## 2014-06-27 DIAGNOSIS — F17200 Nicotine dependence, unspecified, uncomplicated: Secondary | ICD-10-CM | POA: Insufficient documentation

## 2014-06-27 DIAGNOSIS — D279 Benign neoplasm of unspecified ovary: Secondary | ICD-10-CM | POA: Insufficient documentation

## 2014-06-27 DIAGNOSIS — D3910 Neoplasm of uncertain behavior of unspecified ovary: Secondary | ICD-10-CM

## 2014-06-27 NOTE — Progress Notes (Signed)
Consult Note: Gyn-Onc   [name] 58 y.o. female  Chief Complaint  Patient presents with  . 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  Plan patient return to see [place] in 3 months and have repeat CA 125. I reviewed the pathology findings with the patient. There is no need for further therapy. She is aware that there is a small chance of recurrence.   Interval history: The patient returns for her first postoperative checkup today. Since hospital discharge she's done well. She has some diarrhea which is intermittent. Otherwise her pain has resolved and her activity level is nearly 100%. She has no other GI or GU symptoms. She denies any pelvic pain pressure vaginal bleeding or discharge.   HPI: Patient is seen in consultation request of Dr. Armandina Stammer regarding management of a newly diagnosed large abdominal pelvic mass. Patient notes that she's had several months of increasing abdominal girth and weight gain. She became so uncomfortable she saw her gynecologist who obtained a CT scan showing a 32 x 20 x 29.5 cm abdominal pelvic mass which is predominantly cystic with multiple septations although there is a relatively large complex soft tissue component seen anteriorly. Patient is bilateral hydronephrosis secondary to extrinsic compression. Otherwise is no evidence of adenopathy ascites or any other suggestion of metastatic disease. The patient CA 125 is 81.9 units per mL and CEA is 4.9 ng per mL.  She has no past gynecologic history. Sexual history gravida 1. She has been menopausal for approximately 9 years.  Review of Systems:10 point review of systems is negative except as noted in interval history.   Vitals: Blood pressure 117/68, pulse 69, temperature 98.4 F (36.9 C), temperature source Oral, resp. rate 16, height 5\' 7"  (1.702 m), weight 181 lb 6.4 oz (82.283 kg).  Physical  Exam: General : The patient is a healthy woman in no acute distress.  HEENT: normocephalic, extraoccular movements normal; neck is supple without thyromegally  Lynphnodes: Supraclavicular and inguinal nodes not enlarged  Abdomen: Soft nontender no masses organomegaly. Incision is healing well. Pelvic:  EGBUS: Normal female  Vagina: Normal, no lesions  Urethra and Bladder: Normal, non-tender  Cervix: Surgically absent Uterus: Surgically absent Bi-manual examination: Non-tender; no adenxal masses or nodularity  Rectal: normal sphincter tone, no masses, no blood  Lower extremities: No edema or varicosities. Normal range of motion      No Known Allergies  Past Medical History  Diagnosis Date  . Pelvic mass in female   . Shortness of breath   . Swelling of left lower extremity for last year    and right lower extremity, props legs uo to help with swelling  . Pelvic mass     Past Surgical History  Procedure Laterality Date  . Cystectomy      "on privates" 25 years ago  . Laparotomy N/A 05/17/2014    Procedure: EXPLORATORY LAPAROTOMY TOTAL ABDOMINAL HYSTERECTOMY BILATERAL SALPINGO OOPHORECTOMY, URETERLYSIS, OMENTAL BIOPSY;  Surgeon: Alvino Chapel, MD;  Location: [facility];  Service: Gynecology;  Laterality: N/A;    Current Outpatient Prescriptions  Medication Sig Dispense Refill  . albuterol (PROVENTIL HFA) 108 (90 BASE) MCG/ACT inhaler Inhale 2 puffs into the lungs every 6 (six) hours as needed for wheezing or shortness of breath.      . metroNIDAZOLE (FLAGYL) 500 MG tablet Take 4 tablets (2,000 mg total) by mouth once.  4 tablet  0  .  oxyCODONE-acetaminophen (PERCOCET/ROXICET) 5-325 MG per tablet Take 1-2 tablets by mouth every 4 (four) hours as needed for severe pain.      Marland Kitchen ibuprofen (ADVIL,MOTRIN) 200 MG tablet Take 200 mg by mouth every 6 (six) hours as needed for mild pain.       No current facility-administered medications for this visit.    History   Social  History  . Marital Status: Divorced    Spouse Name: N/A    Number of Children: N/A  . Years of Education: N/A   Occupational History  . Not on file.   Social History Main Topics  . Smoking status: Current Every Day Smoker -- 0.25 packs/day for 41 years  . Smokeless tobacco: Never Used  . Alcohol Use: Yes     Comment: occasional beer  . Drug Use: No  . Sexual Activity: Not Currently   Other Topics Concern  . Not on file   Social History Narrative  . No narrative on file    Family History  Problem Relation Age of Onset  . Breast cancer Mother   . Lung cancer Maternal Aunt       Alvino Chapel, MD 06/27/2014, 1:07 PM

## 2014-06-27 NOTE — Patient Instructions (Signed)
Follow up in 3 months with GYN Oncology

## 2014-08-26 ENCOUNTER — Ambulatory Visit: Payer: Self-pay | Attending: Gynecologic Oncology | Admitting: Gynecologic Oncology

## 2014-08-26 ENCOUNTER — Encounter: Payer: Self-pay | Admitting: Gynecologic Oncology

## 2014-08-26 ENCOUNTER — Ambulatory Visit: Payer: Self-pay

## 2014-08-26 VITALS — BP 105/78 | HR 80 | Temp 98.5°F | Resp 20 | Ht 67.0 in | Wt 185.0 lb

## 2014-08-26 DIAGNOSIS — R1900 Intra-abdominal and pelvic swelling, mass and lump, unspecified site: Secondary | ICD-10-CM | POA: Insufficient documentation

## 2014-08-26 DIAGNOSIS — D489 Neoplasm of uncertain behavior, unspecified: Principal | ICD-10-CM

## 2014-08-26 DIAGNOSIS — F17200 Nicotine dependence, unspecified, uncomplicated: Secondary | ICD-10-CM | POA: Insufficient documentation

## 2014-08-26 DIAGNOSIS — Z79899 Other long term (current) drug therapy: Secondary | ICD-10-CM | POA: Insufficient documentation

## 2014-08-26 DIAGNOSIS — N1330 Unspecified hydronephrosis: Secondary | ICD-10-CM | POA: Insufficient documentation

## 2014-08-26 DIAGNOSIS — C569 Malignant neoplasm of unspecified ovary: Secondary | ICD-10-CM

## 2014-08-26 NOTE — Progress Notes (Signed)
Consult Note: Gyn-Onc   [name] 58 y.o. female  Chief Complaint  Patient presents with  . 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 CA 125 pending - will followup.  Plan patient return to see [place] in 3 months and have repeat CA 125. She is aware that there is a small chance of recurrence. We reviewed symptoms concerning for recurrence (including abdominal bloating, early satiety, pelvic or abdominal pains, new lower extremity edema, cough, change in bladder or bowel habit, or weight loss) and the patient was informed to contact us if she notes these symptoms in between her scheduled surveillance visits.  Interval history: The patient returns for her first routine surveillance visit. She is doing well with major issues of new back injury and left finger injury. She denies symptoms concerning for recurrence including: vaginal bleeding, pelvic pain or pressure, abdominal pain, change in bladder or bowel habit, new lower extremity edema, cough, early satiety, nausea or emesis).   HPI: Patient is seen in consultation request of Dr. Armandina Stammer regarding management of a newly diagnosed large abdominal pelvic mass. Patient notes that she's had several months of increasing abdominal girth and weight gain. She became so uncomfortable she saw her gynecologist who obtained a CT scan showing a 32 x 20 x 29.5 cm abdominal pelvic mass which is predominantly cystic with multiple septations although there is a relatively large complex soft tissue component seen anteriorly. Patient is bilateral hydronephrosis secondary to extrinsic compression. Otherwise is no evidence of adenopathy ascites or any other suggestion of metastatic disease. On presentation the patient's CA 125 was 81.9 units per mL and CEA is 4.9 ng per mL.  She has no past gynecologic history. Sexual history gravida 1. She has been  menopausal for approximately 9 years.  Review of Systems:10 point review of systems is negative except as noted in interval history.   Vitals: Blood pressure 105/78, pulse 80, temperature 98.5 F (36.9 C), temperature source Oral, resp. rate 20, height 5\' 7"  (1.702 m), weight 185 lb (83.915 kg).  Physical Exam: General : The patient is a healthy woman in no acute distress.  HEENT: normocephalic, extraoccular movements normal; neck is supple without thyromegally  Lynphnodes: Supraclavicular and inguinal nodes not enlarged  Abdomen: Soft nontender no masses organomegaly. Incision is healed. Pelvic:  EGBUS: Normal female  Vagina: Normal, no lesions  Urethra and Bladder: Normal, non-tender  Cervix: Surgically absent Uterus: Surgically absent Bi-manual examination: Non-tender; no adenxal masses or nodularity  Rectal: normal sphincter tone, no masses, no blood  Lower extremities: No edema or varicosities. Normal range of motion      No Known Allergies  Past Medical History  Diagnosis Date  . Pelvic mass in female   . Shortness of breath   . Swelling of left lower extremity for last year    and right lower extremity, props legs uo to help with swelling  . Pelvic mass   . 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 Cystectomy      "on privates" 25 years ago  . Laparotomy N/A 05/17/2014    Procedure: EXPLORATORY LAPAROTOMY TOTAL ABDOMINAL HYSTERECTOMY BILATERAL SALPINGO OOPHORECTOMY, URETERLYSIS, OMENTAL BIOPSY;  Surgeon: Alvino Chapel, MD;  Location: [facility];  Service: Gynecology;  Laterality: N/A;    Current Outpatient Prescriptions  Medication Sig  Dispense Refill  . albuterol (PROVENTIL HFA) 108 (90 BASE) MCG/ACT inhaler Inhale 2 puffs into the lungs every 6 (six) hours as needed for wheezing or shortness of breath.      Marland Kitchen ibuprofen (ADVIL,MOTRIN) 200 MG tablet Take 200 mg by mouth every 6 (six) hours as  needed for mild pain.      . metroNIDAZOLE (FLAGYL) 500 MG tablet Take 4 tablets (2,000 mg total) by mouth once.  4 tablet  0  . oxyCODONE-acetaminophen (PERCOCET/ROXICET) 5-325 MG per tablet Take 1-2 tablets by mouth every 4 (four) hours as needed for severe pain.       No current facility-administered medications for this visit.    History   Social History  . Marital Status: Divorced    Spouse Name: N/A    Number of Children: N/A  . Years of Education: N/A   Occupational History  . Not on file.   Social History Main Topics  . Smoking status: Current Every Day Smoker -- 0.25 packs/day for 41 years  . Smokeless tobacco: Never Used  . Alcohol Use: Yes     Comment: occasional beer  . Drug Use: No  . Sexual Activity: Not Currently   Other Topics Concern  . Not on file   Social History Narrative  . No narrative on file    Family History  Problem Relation Age of Onset  . Breast cancer Mother   . Lung cancer Maternal Aunt       Donaciano Eva, MD 08/26/2014, 10:50 AM

## 2014-08-29 ENCOUNTER — Telehealth: Payer: Self-pay | Admitting: *Deleted

## 2014-08-29 LAB — CA 125: CA 125: 6 U/mL (ref <35)

## 2014-08-29 LAB — CA 125(PREVIOUS METHOD): CA 125: 5.3 U/mL (ref 0.0–30.2)

## 2014-08-29 NOTE — Telephone Encounter (Signed)
Called pt cell# -unable to leave message. Recording " unavailable" Called pt home # notified pt CA125 low and normal. Pt thanked me for the call no further concerns.

## 2014-08-29 NOTE — Telephone Encounter (Signed)
Message copied by Lucile Crater on Mon Aug 29, 2014  4:51 PM ------      Message from: Everitt Amber C      Created: Mon Aug 29, 2014  4:13 PM       Can we let Jaylin know that her CA 125 is low and normal.      Thanks      Donaciano Eva, MD       ------

## 2014-11-18 ENCOUNTER — Ambulatory Visit: Payer: Self-pay

## 2014-11-18 ENCOUNTER — Ambulatory Visit: Payer: Self-pay | Attending: Gynecology | Admitting: Gynecology

## 2014-11-18 ENCOUNTER — Encounter: Payer: Self-pay | Admitting: Gynecology

## 2014-11-18 VITALS — BP 105/64 | HR 88 | Temp 97.6°F | Resp 26 | Wt 183.2 lb

## 2014-11-18 DIAGNOSIS — F17210 Nicotine dependence, cigarettes, uncomplicated: Secondary | ICD-10-CM | POA: Insufficient documentation

## 2014-11-18 DIAGNOSIS — Z8543 Personal history of malignant neoplasm of ovary: Secondary | ICD-10-CM

## 2014-11-18 DIAGNOSIS — C569 Malignant neoplasm of unspecified ovary: Secondary | ICD-10-CM

## 2014-11-18 DIAGNOSIS — D0739 Carcinoma in situ of other female genital organs: Secondary | ICD-10-CM | POA: Insufficient documentation

## 2014-11-18 DIAGNOSIS — N1330 Unspecified hydronephrosis: Secondary | ICD-10-CM | POA: Insufficient documentation

## 2014-11-18 DIAGNOSIS — D489 Neoplasm of uncertain behavior, unspecified: Secondary | ICD-10-CM

## 2014-11-18 DIAGNOSIS — Z79899 Other long term (current) drug therapy: Secondary | ICD-10-CM | POA: Insufficient documentation

## 2014-11-18 NOTE — Patient Instructions (Signed)
We will contact you with the results of your lab work from today.  Plan to follow up in six months or sooner if needed.  Happy Holidays!

## 2014-11-18 NOTE — Progress Notes (Signed)
Consult Note: Gyn-Onc   [name] 58 y.o. female  Chief Complaint  Patient presents with  . 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 Clinically free of disease.  Plan CA-125 and CEA are obtained today. The patient return to see [place] in 6 months and have repeat CA 125 and CEA.   Interval history: The patient returns f As previously scheduled for routine follow-up. Since her last visit she's done well. Her functional status is excellent she continues to work full-time.. She has no other GI or GU symptoms. She denies any pelvic pain pressure vaginal bleeding or discharge.   HPI: Patient is seen in consultation request of Dr. Armandina Stammer regarding management of a newly diagnosed large abdominal pelvic mass. Patient notes that she's had several months of increasing abdominal girth and weight gain. She became so uncomfortable she saw her gynecologist who obtained a CT scan showing a 32 x 20 x 29.5 cm abdominal pelvic mass which is predominantly cystic with multiple septations although there is a relatively large complex soft tissue component seen anteriorly. Patient is bilateral hydronephrosis secondary to extrinsic compression. Otherwise is no evidence of adenopathy ascites or any other suggestion of metastatic disease. The patient CA 125 is 81.9 units per mL and CEA is 4.9 ng per mL.  She has no past gynecologic history. Sexual history gravida 1. She has been menopausal for approximately 9 years.  She underwent exploratory laparotomy and resection of a 29.5 cm ovarian mass on 05/17/2014. 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   Vitals: Blood pressure 105/64, pulse 88, temperature 97.6 F (36.4 C), temperature source Oral, resp. rate 26, weight 183 lb 3.2  oz (83.099 kg).  Physical Exam: General : The patient is a healthy woman in no acute distress.  HEENT: normocephalic, extraoccular movements normal; neck is supple without thyromegally  Lynphnodes: Supraclavicular and inguinal nodes not enlarged  Abdomen: Soft nontender no masses organomegaly. Incision is well-healed and no hernias are noted. Pelvic:  EGBUS: Normal female  Vagina: Normal, no lesions  Urethra and Bladder: Normal, non-tender  Cervix: Surgically absent Uterus: Surgically absent Bi-manual examination: Non-tender; no adenxal masses or nodularity  Rectal: normal sphincter tone, no masses, no blood  Lower extremities: No edema or varicosities. Normal range of motion      No Known Allergies  Past Medical History  Diagnosis Date  . Pelvic mass in female   . Shortness of breath   . Swelling of left lower extremity for last year    and right lower extremity, props legs uo to help with swelling  . Pelvic mass   . 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 Cystectomy      "on privates" 25 years ago  . Laparotomy N/A 05/17/2014    Procedure: EXPLORATORY LAPAROTOMY TOTAL ABDOMINAL HYSTERECTOMY BILATERAL SALPINGO OOPHORECTOMY, URETERLYSIS, OMENTAL BIOPSY;  Surgeon: Alvino Chapel, MD;  Location: [facility];  Service: Gynecology;  Laterality: N/A;    Current Outpatient Prescriptions  Medication Sig Dispense Refill  . albuterol (PROVENTIL HFA) 108 (90 BASE) MCG/ACT inhaler Inhale 2 puffs into the lungs every 6 (six) hours as needed for wheezing or shortness of breath.    Marland Kitchen ibuprofen (ADVIL,MOTRIN) 200 MG tablet Take 200  mg by mouth every 6 (six) hours as needed for mild pain.    Marland Kitchen ketorolac (TORADOL) 10 MG tablet   0  . metroNIDAZOLE (FLAGYL) 500 MG tablet Take 4 tablets (2,000 mg total) by mouth once. 4 tablet 0  . oxyCODONE-acetaminophen (PERCOCET/ROXICET) 5-325 MG per tablet Take 1-2 tablets by mouth every  4 (four) hours as needed for severe pain.     No current facility-administered medications for this visit.    History   Social History  . Marital Status: Divorced    Spouse Name: N/A    Number of Children: N/A  . Years of Education: N/A   Occupational History  . Not on file.   Social History Main Topics  . Smoking status: Current Every Day Smoker -- 0.25 packs/day for 41 years    Types: Cigarettes  . Smokeless tobacco: Never Used  . Alcohol Use: Yes     Comment: occasional beer  . Drug Use: No  . Sexual Activity: Not Currently   Other Topics Concern  . Not on file   Social History Narrative    Family History  Problem Relation Age of Onset  . Breast cancer Mother   . Lung cancer Maternal Aunt       Alvino Chapel, MD 11/18/2014, 12:17 PM

## 2014-11-19 LAB — CA 125(PREVIOUS METHOD): CA 125: 5.7 U/mL (ref 0.0–30.2)

## 2014-11-19 LAB — CEA: CEA: 3.6 ng/mL (ref 0.0–5.0)

## 2014-11-19 LAB — CA 125: CA 125: 6 U/mL (ref <35)

## 2014-11-22 ENCOUNTER — Telehealth: Payer: Self-pay | Admitting: *Deleted

## 2014-11-22 NOTE — Telephone Encounter (Signed)
-----   Message from Dorothyann Gibbs, NP sent at 11/21/2014 10:09 AM EST ----- Please let her know that her CA 125 is normal.  Thanks! ----- Message -----    From: [facility]    Sent: 11/19/2014   5:47 AM      To: Dorothyann Gibbs, NP

## 2014-11-22 NOTE — Telephone Encounter (Signed)
Called pt mobile # unavailable, left message with partner on home # to have pt call office regarding her results.

## 2015-05-04 ENCOUNTER — Encounter: Payer: Self-pay | Admitting: Gynecology

## 2015-05-04 ENCOUNTER — Other Ambulatory Visit (HOSPITAL_BASED_OUTPATIENT_CLINIC_OR_DEPARTMENT_OTHER): Payer: Self-pay

## 2015-05-04 ENCOUNTER — Ambulatory Visit: Payer: Self-pay | Attending: Gynecology | Admitting: Gynecology

## 2015-05-04 VITALS — BP 106/73 | HR 64 | Temp 97.9°F | Resp 18 | Ht 67.0 in | Wt 187.0 lb

## 2015-05-04 DIAGNOSIS — Z90710 Acquired absence of both cervix and uterus: Secondary | ICD-10-CM

## 2015-05-04 DIAGNOSIS — C569 Malignant neoplasm of unspecified ovary: Secondary | ICD-10-CM

## 2015-05-04 DIAGNOSIS — R109 Unspecified abdominal pain: Secondary | ICD-10-CM

## 2015-05-04 DIAGNOSIS — D489 Neoplasm of uncertain behavior, unspecified: Secondary | ICD-10-CM

## 2015-05-04 DIAGNOSIS — Z86018 Personal history of other benign neoplasm: Secondary | ICD-10-CM

## 2015-05-04 NOTE — Patient Instructions (Addendum)
Try taking ibuprofen every 6 hours and placing a heating pad on your right side for the next few days. If the pain does not get better in the next 7-10 days, please call us back.  Plan to followup in 6 months with Dr. Fermin Schwab as planned and we will call you with the lab results from today early next week.  [facility] - call [phone] if you would like to set an appointment to establish care with a primary care doctor.

## 2015-05-04 NOTE — Progress Notes (Signed)
Consult Note: Gyn-Onc   [name] 59 y.o. female  Chief Complaint  Patient presents with  . 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 Clinically free of disease. Right flank pain or question etiology.  Plan CA-125 is obtained today. The patient return to see [place] in 6 months and have repeat CA 125. She will use ibuprofen and heat to the right flank. She'll call us in 10 days if she is not better.   Interval history: The patient returns for continuing follow-up. . Since her last visit she's done well. Her functional status is excellent she continues to work full-time.. She has no other GI or GU symptoms. She denies any pelvic pain pressure vaginal bleeding or discharge. Her only complaint is that she's had approximately 1 week of pain in her right flank. She denies any trauma. Otherwise she has no symptoms.   HPI: Patient is seen in consultation request of Dr. Armandina Stammer regarding management of a newly diagnosed large abdominal pelvic mass. Patient notes that she's had several months of increasing abdominal girth and weight gain. She became so uncomfortable she saw her gynecologist who obtained a CT scan showing a 32 x 20 x 29.5 cm abdominal pelvic mass which is predominantly cystic with multiple septations although there is a relatively large complex soft tissue component seen anteriorly. Patient is bilateral hydronephrosis secondary to extrinsic compression. Otherwise is no evidence of adenopathy ascites or any other suggestion of metastatic disease. The patient CA 125 is 81.9 units per mL and CEA is 4.9 ng per mL.  She has no past gynecologic history. Sexual history gravida 1. She has been menopausal for approximately 9 years.  She underwent exploratory laparotomy and resection of a 29.5 cm ovarian mass on 05/17/2014. Final pathology showed a stage I mucinous low  malignant potential tumor of the ovary  Review of Systems:10 point review of systems is negative except as noted in interval history.   Vitals: Blood pressure 106/73, pulse 64, temperature 97.9 F (36.6 C), temperature source Oral, resp. rate 18, height 5\' 7"  (1.702 m), weight 187 lb (84.823 kg).  Physical Exam: General : The patient is a healthy woman in no acute distress.  HEENT: normocephalic, extraoccular movements normal; neck is supple without thyromegally  Lynphnodes: Supraclavicular and inguinal nodes not enlarged  Abdomen: Soft, no masses organomegaly. On palpation over the right external oblique muscle there is pain to palpation. There are no masses. No evidence of ascites. Incision is well-healed and no hernias are noted. Pelvic:  EGBUS: Normal female  Vagina: Normal, no lesions  Urethra and Bladder: Normal, non-tender  Cervix: Surgically absent Uterus: Surgically absent Bi-manual examination: Non-tender; no adenxal masses or nodularity  Rectal: normal sphincter tone, no masses, no blood  Lower extremities: No edema or varicosities. Normal range of motion      No Known Allergies  Past Medical History  Diagnosis Date  . Pelvic mass in female   . Shortness of breath   . Swelling of left lower extremity for last year    and right lower extremity, props legs uo to help with swelling  . Pelvic mass   . 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 Cystectomy      "on privates" 25 years ago  . Laparotomy N/A 05/17/2014    Procedure: EXPLORATORY LAPAROTOMY TOTAL ABDOMINAL HYSTERECTOMY BILATERAL SALPINGO  OOPHORECTOMY, URETERLYSIS, OMENTAL BIOPSY;  Surgeon: Alvino Chapel, MD;  Location: [facility];  Service: Gynecology;  Laterality: N/A;    Current Outpatient Prescriptions  Medication Sig Dispense Refill  . albuterol (PROVENTIL HFA) 108 (90 BASE) MCG/ACT inhaler Inhale 2 puffs into the lungs every 6 (six)  hours as needed for wheezing or shortness of breath.    Marland Kitchen ibuprofen (ADVIL,MOTRIN) 200 MG tablet Take 200 mg by mouth every 6 (six) hours as needed for mild pain.    Marland Kitchen levothyroxine (SYNTHROID, LEVOTHROID) 75 MCG tablet Take 75 mcg by mouth daily before breakfast.     No current facility-administered medications for this visit.    History   Social History  . Marital Status: Divorced    Spouse Name: N/A  . Number of Children: N/A  . Years of Education: N/A   Occupational History  . Not on file.   Social History Main Topics  . Smoking status: Current Every Day Smoker -- 0.25 packs/day for 41 years    Types: Cigarettes  . Smokeless tobacco: Never Used  . Alcohol Use: Yes     Comment: occasional beer  . Drug Use: No  . Sexual Activity: Not Currently   Other Topics Concern  . Not on file   Social History Narrative    Family History  Problem Relation Age of Onset  . Breast cancer Mother   . Lung cancer Maternal Aunt       Alvino Chapel, MD 05/04/2015, 2:52 PM

## 2015-05-05 ENCOUNTER — Telehealth: Payer: Self-pay | Admitting: *Deleted

## 2015-05-05 LAB — CA 125: CA 125: 6 U/mL (ref <35)

## 2015-05-05 NOTE — Telephone Encounter (Signed)
Per Joylene John, NP patient notified of normal CA 125 results. Patient very appreciative of call.

## 2015-05-18 ENCOUNTER — Encounter: Payer: Self-pay | Admitting: Gynecology

## 2015-06-10 IMAGING — CR DG CHEST 2V
2 series · 2 of 2 positions shown · non-contrast
Comparison: February 23, 2014.

CLINICAL DATA: Preop hysterectomy.

EXAM:
CHEST  2 VIEW

[w chest pa]
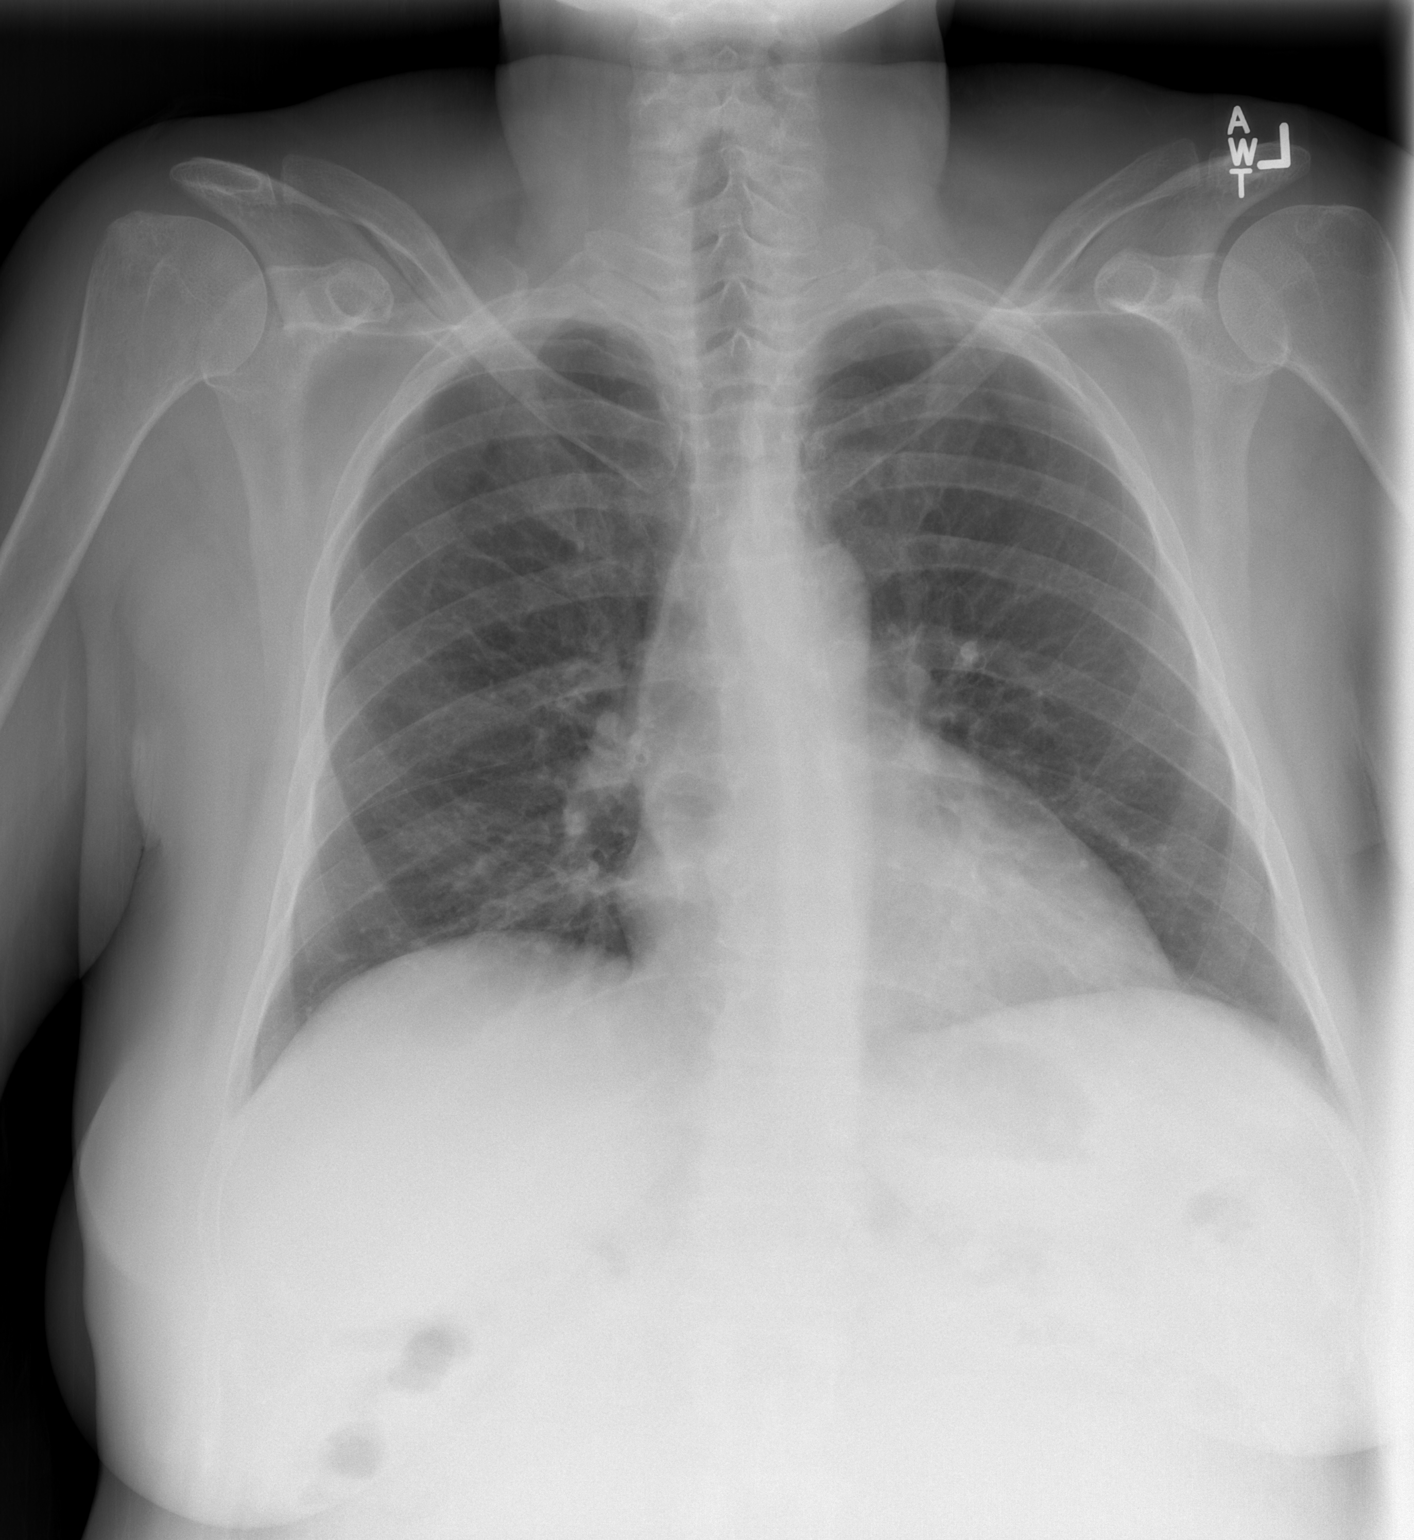

[w chest lat]
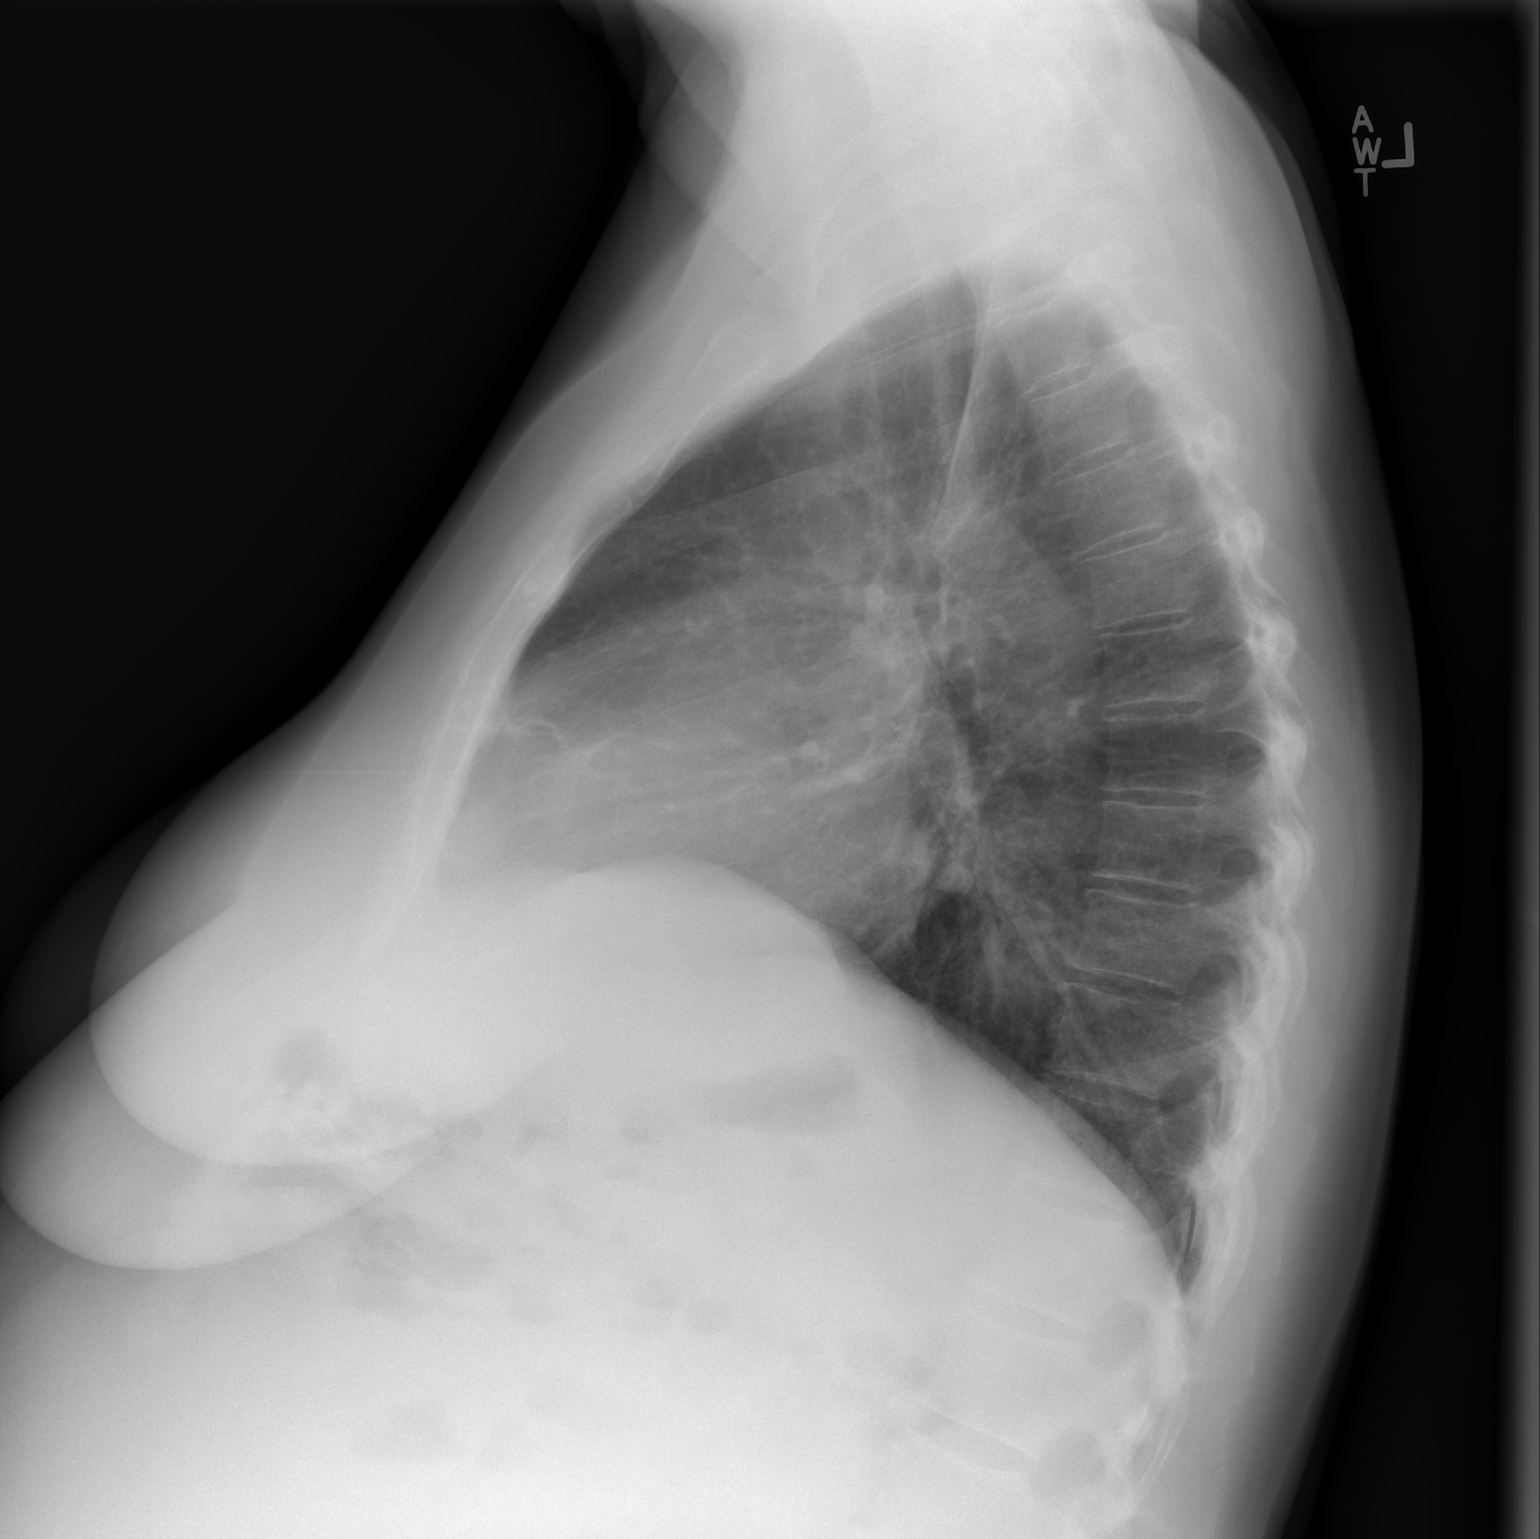

[2 of 2 positions shown; findings below may reference images not displayed]

FINDINGS: The heart size and mediastinal contours are within normal limits.
Both lungs are clear. No pneumothorax or pleural effusion is noted.
The visualized skeletal structures are unremarkable.
IMPRESSION: No acute cardiopulmonary abnormality seen.

## 2015-11-17 ENCOUNTER — Other Ambulatory Visit: Payer: Self-pay

## 2015-11-17 ENCOUNTER — Ambulatory Visit: Payer: Self-pay | Attending: Gynecology | Admitting: Gynecology

## 2015-11-17 ENCOUNTER — Encounter: Payer: Self-pay | Admitting: Gynecology

## 2015-11-17 VITALS — BP 102/60 | HR 88 | Temp 98.1°F | Resp 18 | Ht 67.0 in | Wt 183.5 lb

## 2015-11-17 DIAGNOSIS — Z8603 Personal history of neoplasm of uncertain behavior: Secondary | ICD-10-CM

## 2015-11-17 DIAGNOSIS — C569 Malignant neoplasm of unspecified ovary: Secondary | ICD-10-CM

## 2015-11-17 DIAGNOSIS — D489 Neoplasm of uncertain behavior, unspecified: Principal | ICD-10-CM

## 2015-11-17 NOTE — Patient Instructions (Signed)
Follow up with Dr Fermin Schwab in 6 months , call with any changes , questions or concerns.  Thank you !

## 2015-11-17 NOTE — Progress Notes (Signed)
Consult Note: Gyn-Onc   [name] 59 y.o. female  No chief complaint on file.   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 Clinically free of disease.  Plan CA-125 is obtained today. The patient return to see [place] in 6 months and have repeat CA 125. Marland Kitchen   Interval history: The patient returns for continuing follow-up. . Since her last visit she's done well. Her functional status is excellent she continues to work full-time.. She has no other GI or GU symptoms. She denies any pelvic pain pressure vaginal bleeding or discharge. Otherwise she has no symptoms.   HPI: Patient is seen in consultation request of Dr. Armandina Stammer regarding management of a newly diagnosed large abdominal pelvic mass. Patient notes that she's had several months of increasing abdominal girth and weight gain. She became so uncomfortable she saw her gynecologist who obtained a CT scan showing a 32 x 20 x 29.5 cm abdominal pelvic mass which is predominantly cystic with multiple septations although there is a relatively large complex soft tissue component seen anteriorly. Patient is bilateral hydronephrosis secondary to extrinsic compression. Otherwise is no evidence of adenopathy ascites or any other suggestion of metastatic disease. The patient CA 125 is 81.9 units per mL and CEA is 4.9 ng per mL.  She has no past gynecologic history. Sexual history gravida 1. She has been menopausal for approximately 9 years.  She underwent exploratory laparotomy and resection of a 29.5 cm ovarian mass on 05/17/2014. 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   Vitals: Blood pressure 102/60, pulse 88, temperature 98.1 F (36.7 C), temperature source Oral, resp. rate 18, height 5\' 7"  (1.702 m), weight 183 lb 8 oz (83.235  kg), SpO2 95 %.  Physical Exam: General : The patient is a healthy woman in no acute distress.  HEENT: normocephalic, extraoccular movements normal; neck is supple without thyromegally  Lynphnodes: Supraclavicular and inguinal nodes not enlarged  Abdomen: Soft, no masses organomegaly. There are no masses. No evidence of ascites. Incision is well-healed and no hernias are noted. Pelvic:  EGBUS: Normal female  Vagina: Normal, no lesions  Urethra and Bladder: Normal, non-tender  Cervix: Surgically absent Uterus: Surgically absent Bi-manual examination: Non-tender; no adenxal masses or nodularity  Rectal: normal sphincter tone, no masses, no blood  Lower extremities: No edema or varicosities. Normal range of motion      No Known Allergies  Past Medical History  Diagnosis Date  . Pelvic mass in female   . Shortness of breath   . Swelling of left lower extremity for last year    and right lower extremity, props legs uo to help with swelling  . Pelvic mass   . 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 Cystectomy      "on privates" 25 years ago  . Laparotomy N/A 05/17/2014    Procedure: EXPLORATORY LAPAROTOMY TOTAL ABDOMINAL HYSTERECTOMY BILATERAL SALPINGO OOPHORECTOMY, URETERLYSIS, OMENTAL BIOPSY;  Surgeon: Alvino Chapel, MD;  Location: [facility];  Service: Gynecology;  Laterality: N/A;    Current Outpatient Prescriptions  Medication Sig Dispense Refill  . albuterol (PROVENTIL HFA) 108 (90 BASE) MCG/ACT inhaler Inhale 2 puffs into the lungs every 6 (six) hours as needed for wheezing or shortness of breath.    Marland Kitchen  ibuprofen (ADVIL,MOTRIN) 200 MG tablet Take 200 mg by mouth every 6 (six) hours as needed for mild pain.    . traMADol (ULTRAM) 50 MG tablet Take 50 mg by mouth every 6 (six) hours as needed for moderate pain.     No current facility-administered medications for this visit.    Social History   Social  History  . Marital Status: Divorced    Spouse Name: N/A  . Number of Children: N/A  . Years of Education: N/A   Occupational History  . Not on file.   Social History Main Topics  . Smoking status: Current Every Day Smoker -- 0.25 packs/day for 41 years    Types: Cigarettes  . Smokeless tobacco: Never Used  . Alcohol Use: Yes     Comment: occasional beer  . Drug Use: No  . Sexual Activity: Not Currently   Other Topics Concern  . Not on file   Social History Narrative    Family History  Problem Relation Age of Onset  . Breast cancer Mother   . Lung cancer Maternal Aunt       Alvino Chapel, MD 11/17/2015, 12:28 PM

## 2015-11-18 LAB — CA 125: CA 125: 5 U/mL (ref <35)

## 2015-11-20 ENCOUNTER — Telehealth: Payer: Self-pay

## 2015-11-20 NOTE — Telephone Encounter (Signed)
Orders recieved from [place] to contact the patient to updated with CA 125 level ( 5 ) "normal" . Attempted to contact the patient , no answer , left a detail;ed message with CA 125 results. Also instructed the patient to call with any changes , questions or concerns.

## 2016-05-10 ENCOUNTER — Ambulatory Visit: Payer: Self-pay | Admitting: Gynecology

## 2016-05-17 ENCOUNTER — Other Ambulatory Visit (HOSPITAL_BASED_OUTPATIENT_CLINIC_OR_DEPARTMENT_OTHER): Payer: Self-pay

## 2016-05-17 ENCOUNTER — Ambulatory Visit: Payer: Self-pay | Attending: Gynecology | Admitting: Gynecology

## 2016-05-17 ENCOUNTER — Encounter: Payer: Self-pay | Admitting: Gynecology

## 2016-05-17 VITALS — BP 92/66 | HR 78 | Temp 98.1°F | Resp 18 | Ht 67.0 in | Wt 191.5 lb

## 2016-05-17 DIAGNOSIS — Z79899 Other long term (current) drug therapy: Secondary | ICD-10-CM | POA: Insufficient documentation

## 2016-05-17 DIAGNOSIS — C569 Malignant neoplasm of unspecified ovary: Secondary | ICD-10-CM | POA: Insufficient documentation

## 2016-05-17 DIAGNOSIS — D070 Carcinoma in situ of endometrium: Secondary | ICD-10-CM

## 2016-05-17 DIAGNOSIS — D489 Neoplasm of uncertain behavior, unspecified: Principal | ICD-10-CM

## 2016-05-17 DIAGNOSIS — N1330 Unspecified hydronephrosis: Secondary | ICD-10-CM | POA: Insufficient documentation

## 2016-05-17 DIAGNOSIS — F17210 Nicotine dependence, cigarettes, uncomplicated: Secondary | ICD-10-CM | POA: Insufficient documentation

## 2016-05-17 DIAGNOSIS — Z9889 Other specified postprocedural states: Secondary | ICD-10-CM | POA: Insufficient documentation

## 2016-05-17 DIAGNOSIS — D099 Carcinoma in situ, unspecified: Secondary | ICD-10-CM | POA: Insufficient documentation

## 2016-05-17 NOTE — Progress Notes (Signed)
Consult Note: Gyn-Onc   [name] 60 y.o. female  Chief Complaint  Patient presents with  . Follow-up  . 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 Clinically free of disease.  Plan CA-125 is obtained today. The patient return to see [place] in 12 months and have repeat CA 125. .  She's encouraged to have annual mammograms. She has not had colonoscopy and have encouraged her to schedule that with the gastroenterologist in [place]   Interval history: The patient returns for continuing follow-up. . Since her last visit she's done well. Her functional status is excellent she continues to work full-time.. She has no other GI or GU symptoms. She denies any pelvic pain pressure vaginal bleeding or discharge. Otherwise she has no symptoms.   HPI: Patient is seen in consultation request of Dr. Armandina Stammer regarding management of a newly diagnosed large abdominal pelvic mass. Patient notes that she's had several months of increasing abdominal girth and weight gain. She became so uncomfortable she saw her gynecologist who obtained a CT scan showing a 32 x 20 x 29.5 cm abdominal pelvic mass which is predominantly cystic with multiple septations although there is a relatively large complex soft tissue component seen anteriorly. Patient is bilateral hydronephrosis secondary to extrinsic compression. Otherwise is no evidence of adenopathy ascites or any other suggestion of metastatic disease. The patient CA 125 is 81.9 units per mL and CEA is 4.9 ng per mL.  She has no past gynecologic history. Sexual history gravida 1. She has been menopausal for approximately 9 years.  She underwent exploratory laparotomy and resection of a 29.5 cm ovarian mass on 05/17/2014. 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   Vitals: Blood pressure 92/66, pulse 78, temperature 98.1 F (36.7 C), temperature source Oral, resp. rate 18, height 5\' 7"  (1.702 m), weight 191 lb 8 oz (86.864 kg), SpO2 97 %.  Physical Exam: General : The patient is a healthy woman in no acute distress.  HEENT: normocephalic, extraoccular movements normal; neck is supple without thyromegally  Lynphnodes: Supraclavicular and inguinal nodes not enlarged  Abdomen: Soft, no masses organomegaly. There are no masses. No evidence of ascites. Incision is well-healed and no hernias are noted. Pelvic:  EGBUS: Normal female  Vagina: Normal, no lesions  Urethra and Bladder: Normal, non-tender  Cervix: Surgically absent Uterus: Surgically absent Bi-manual examination: Non-tender; no adenxal masses or nodularity  Rectal: normal sphincter tone, no masses, no blood  Lower extremities: No edema or varicosities. Normal range of motion      No Known Allergies  Past Medical History  Diagnosis Date  . Pelvic mass in female   . Shortness of breath   . Swelling of left lower extremity for last year    and right lower extremity, props legs uo to help with swelling  . Pelvic mass   . 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 Cystectomy      "on privates" 25 years ago  . Laparotomy N/A 05/17/2014    Procedure: EXPLORATORY LAPAROTOMY TOTAL ABDOMINAL HYSTERECTOMY BILATERAL SALPINGO OOPHORECTOMY, URETERLYSIS, OMENTAL BIOPSY;  Surgeon: Alvino Chapel, MD;  Location: [facility];  Service: Gynecology;  Laterality: N/A;    Current Outpatient Prescriptions  Medication  Sig Dispense Refill  . albuterol (PROVENTIL HFA) 108 (90 BASE) MCG/ACT inhaler Inhale 2 puffs into the lungs every 6 (six) hours as needed for wheezing or shortness of breath.    Marland Kitchen ibuprofen (ADVIL,MOTRIN) 200 MG tablet Take 200 mg by mouth every 6 (six) hours as  needed for mild pain.     No current facility-administered medications for this visit.    Social History   Social History  . Marital Status: Divorced    Spouse Name: N/A  . Number of Children: N/A  . Years of Education: N/A   Occupational History  . Not on file.   Social History Main Topics  . Smoking status: Current Every Day Smoker -- 0.50 packs/day for 41 years    Types: Cigarettes  . Smokeless tobacco: Never Used  . Alcohol Use: Yes     Comment: occasional beer  . Drug Use: No  . Sexual Activity: Not Currently   Other Topics Concern  . Not on file   Social History Narrative    Family History  Problem Relation Age of Onset  . Breast cancer Mother   . Lung cancer Maternal Aunt       Alvino Chapel, MD 05/17/2016, 11:48 AM

## 2016-05-17 NOTE — Patient Instructions (Signed)
We will call you with the results of your CA 125 from today.  Plan to follow up in one year or sooner if symptoms arise.  Please call in November or December 2017 to schedule an appointment for May 2018.  Please call for any questions or concerns.

## 2016-05-18 LAB — CANCER ANTIGEN 125 (PARALLEL TESTING): CA 125: 6 U/mL (ref <35)

## 2016-05-18 LAB — CA 125: Cancer Antigen (CA) 125: 8.5 U/mL (ref 0.0–38.1)

## 2016-05-20 ENCOUNTER — Telehealth: Payer: Self-pay

## 2016-05-20 NOTE — Telephone Encounter (Signed)
Orders received from [place], APNP to contact the patient with her CA 125 level ( 8.5 ) results . Patient contact  And updated , patient states understanding and thanked [place] for our care , patient denies further questions at this time.

## 2017-01-01 ENCOUNTER — Telehealth: Payer: Self-pay | Admitting: Nurse Practitioner

## 2017-01-01 NOTE — Telephone Encounter (Signed)
Patient calling to make apt for 1 year f/u with Dr. Fermin Schwab. Apt made 05/02/17 at 1:30, to arrive at 1:15. She is aware and agreeable to this time and date.

## 2017-01-07 ENCOUNTER — Telehealth: Payer: Self-pay | Admitting: *Deleted

## 2017-01-07 NOTE — Telephone Encounter (Signed)
MD out of office 5/4 appt r/s for 5/18 at 230pm. Pt notified and verbalized understanding.

## 2017-05-02 ENCOUNTER — Ambulatory Visit: Payer: Self-pay | Admitting: Gynecology

## 2017-05-08 ENCOUNTER — Telehealth: Payer: Self-pay | Admitting: *Deleted

## 2017-05-08 NOTE — Telephone Encounter (Signed)
I have returned the patient's call regarding her need to reschedule an appt. We have moved the appt from May 18th to June 15th.

## 2017-05-16 ENCOUNTER — Ambulatory Visit: Payer: Self-pay | Admitting: Gynecology

## 2017-06-12 ENCOUNTER — Other Ambulatory Visit: Payer: Self-pay

## 2017-06-12 DIAGNOSIS — D489 Neoplasm of uncertain behavior, unspecified: Principal | ICD-10-CM

## 2017-06-12 DIAGNOSIS — C569 Malignant neoplasm of unspecified ovary: Secondary | ICD-10-CM

## 2017-06-13 ENCOUNTER — Other Ambulatory Visit: Payer: Self-pay

## 2017-06-13 ENCOUNTER — Ambulatory Visit: Payer: Self-pay | Admitting: Gynecology

## 2017-07-11 ENCOUNTER — Ambulatory Visit: Payer: Self-pay | Admitting: Gynecology

## 2017-07-11 ENCOUNTER — Other Ambulatory Visit: Payer: Self-pay

## 2017-07-16 ENCOUNTER — Telehealth: Payer: Self-pay | Admitting: *Deleted

## 2017-07-16 NOTE — Telephone Encounter (Signed)
Attempted to contact the patient regarding her missed appt, unable to reach the patient. Left message for the patient to call the office back.

## 2024-05-18 DIAGNOSIS — Z01818 Encounter for other preprocedural examination: Secondary | ICD-10-CM | POA: Diagnosis not present

## 2024-05-18 DIAGNOSIS — H2511 Age-related nuclear cataract, right eye: Secondary | ICD-10-CM | POA: Diagnosis not present

## 2024-05-27 DIAGNOSIS — F17210 Nicotine dependence, cigarettes, uncomplicated: Secondary | ICD-10-CM | POA: Diagnosis not present

## 2024-05-27 DIAGNOSIS — R7989 Other specified abnormal findings of blood chemistry: Secondary | ICD-10-CM | POA: Diagnosis not present

## 2024-05-27 DIAGNOSIS — I493 Ventricular premature depolarization: Secondary | ICD-10-CM | POA: Diagnosis not present

## 2024-05-27 DIAGNOSIS — M545 Low back pain, unspecified: Secondary | ICD-10-CM | POA: Diagnosis not present

## 2024-05-27 DIAGNOSIS — E7800 Pure hypercholesterolemia, unspecified: Secondary | ICD-10-CM | POA: Diagnosis not present

## 2024-05-27 DIAGNOSIS — E039 Hypothyroidism, unspecified: Secondary | ICD-10-CM | POA: Diagnosis not present

## 2024-05-27 DIAGNOSIS — Z6830 Body mass index (BMI) 30.0-30.9, adult: Secondary | ICD-10-CM | POA: Diagnosis not present

## 2024-05-27 DIAGNOSIS — Z01818 Encounter for other preprocedural examination: Secondary | ICD-10-CM | POA: Diagnosis not present

## 2024-05-27 DIAGNOSIS — Z131 Encounter for screening for diabetes mellitus: Secondary | ICD-10-CM | POA: Diagnosis not present

## 2024-06-22 DIAGNOSIS — F17210 Nicotine dependence, cigarettes, uncomplicated: Secondary | ICD-10-CM | POA: Diagnosis not present

## 2024-06-22 DIAGNOSIS — E785 Hyperlipidemia, unspecified: Secondary | ICD-10-CM | POA: Diagnosis not present

## 2024-06-22 DIAGNOSIS — J45909 Unspecified asthma, uncomplicated: Secondary | ICD-10-CM | POA: Diagnosis not present

## 2024-06-22 DIAGNOSIS — Z79899 Other long term (current) drug therapy: Secondary | ICD-10-CM | POA: Diagnosis not present

## 2024-06-22 DIAGNOSIS — H25811 Combined forms of age-related cataract, right eye: Secondary | ICD-10-CM | POA: Diagnosis not present

## 2024-06-22 DIAGNOSIS — H2511 Age-related nuclear cataract, right eye: Secondary | ICD-10-CM | POA: Diagnosis not present

## 2024-06-22 DIAGNOSIS — I4891 Unspecified atrial fibrillation: Secondary | ICD-10-CM | POA: Diagnosis not present

## 2024-06-22 DIAGNOSIS — E039 Hypothyroidism, unspecified: Secondary | ICD-10-CM | POA: Diagnosis not present

## 2024-06-22 DIAGNOSIS — I493 Ventricular premature depolarization: Secondary | ICD-10-CM

## 2024-06-22 DIAGNOSIS — H259 Unspecified age-related cataract: Secondary | ICD-10-CM | POA: Diagnosis not present
